# Patient Record
Sex: Female | Born: 1990 | Race: Black or African American | Hispanic: No | Marital: Single | Smoking: Current every day smoker
Health system: Southern US, Community
[De-identification: ages and names within clinical notes are randomized; demographics above are authoritative.]

## PROBLEM LIST (undated history)

## (undated) ENCOUNTER — Ambulatory Visit: Discharge status: Absent without leave | Payer: Self-pay

## (undated) DIAGNOSIS — F329 Major depressive disorder, single episode, unspecified: Secondary | ICD-10-CM

## (undated) DIAGNOSIS — F32A Depression, unspecified: Secondary | ICD-10-CM

## (undated) DIAGNOSIS — F319 Bipolar disorder, unspecified: Secondary | ICD-10-CM

## (undated) DIAGNOSIS — F419 Anxiety disorder, unspecified: Secondary | ICD-10-CM

## (undated) SURGERY — Surgical Case
Anesthesia: *Unknown

---
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
## 2012-01-15 ENCOUNTER — Encounter (HOSPITAL_COMMUNITY): Payer: Self-pay

## 2012-01-15 ENCOUNTER — Emergency Department (HOSPITAL_COMMUNITY)
Admission: EM | Admit: 2012-01-15 | Discharge: 2012-01-15 | Disposition: A | Discharge status: Home / Self Care | Payer: Self-pay | Attending: Emergency Medicine | Admitting: Emergency Medicine

## 2012-01-15 DIAGNOSIS — R059 Cough, unspecified: Secondary | ICD-10-CM | POA: Insufficient documentation

## 2012-01-15 DIAGNOSIS — Z79899 Other long term (current) drug therapy: Secondary | ICD-10-CM | POA: Insufficient documentation

## 2012-01-15 DIAGNOSIS — R070 Pain in throat: Secondary | ICD-10-CM | POA: Insufficient documentation

## 2012-01-15 DIAGNOSIS — H6690 Otitis media, unspecified, unspecified ear: Secondary | ICD-10-CM | POA: Insufficient documentation

## 2012-01-15 DIAGNOSIS — R05 Cough: Secondary | ICD-10-CM | POA: Insufficient documentation

## 2012-01-15 DIAGNOSIS — H9209 Otalgia, unspecified ear: Secondary | ICD-10-CM | POA: Insufficient documentation

## 2012-01-15 DIAGNOSIS — J3489 Other specified disorders of nose and nasal sinuses: Secondary | ICD-10-CM | POA: Insufficient documentation

## 2012-01-15 DIAGNOSIS — F319 Bipolar disorder, unspecified: Secondary | ICD-10-CM | POA: Insufficient documentation

## 2012-01-15 HISTORY — DX: Bipolar disorder, unspecified: F31.9

## 2012-01-15 MED ORDER — AMOXICILLIN 500 MG PO CAPS: 500.0000 mg | ORAL_CAPSULE | Freq: Three times a day (TID) | ORAL | Status: AC

## 2012-01-15 MED ORDER — AMOXICILLIN 500 MG PO CAPS
500.0000 mg | ORAL_CAPSULE | Freq: Once | ORAL | Status: AC
  Administered 2012-01-15: 500 mg via ORAL
  Filled 2012-01-15: qty 1
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
## 2012-01-15 NOTE — ED Notes (Signed)
Pt complains of left ear pain since 9pm tonight, unable to sleep

## 2012-01-15 NOTE — ED Provider Notes (Signed)
History     CSN: [id number]  Arrival date & time 01/15/12  0201   First MD Initiated Contact with Patient 01/15/12 [phone]      Chief Complaint  Patient presents with  . Otalgia    (Consider location/radiation/quality/duration/timing/severity/associated sxs/prior treatment) HPI Comments: Patient here with left ear pain - states that she thinks she may have just gotten over a sinus infection and awoke this evening with the ear pain and was unable to return to sleep - deneis fever and chills, drainage from the ear, reports rhinorrhea and mild sore throat.  Patient is a 22 y.o. female presenting with ear pain. The history is provided by the patient. No language interpreter was used.  Otalgia This is a new problem. The current episode started less than 1 hour ago. There is pain in the left ear. The problem occurs constantly. The problem has been gradually worsening. There has been no fever. The pain is at a severity of 6/10. The pain is moderate. Associated symptoms include rhinorrhea and cough. Pertinent negatives include no ear discharge, no headaches, no hearing loss, no sore throat, no abdominal pain, no diarrhea, no vomiting, no neck pain and no rash. Her past medical history does not include chronic ear infection.    Past Medical History  Diagnosis Date  . Bipolar 1 disorder     History reviewed. No pertinent past surgical history.  History reviewed. No pertinent family history.  History  Substance Use Topics  . Smoking status: Current Everyday Smoker  . Smokeless tobacco: Not on file  . Alcohol Use: Yes    OB History    Grav Para Term Preterm Abortions TAB SAB Ect Mult Living                  Review of Systems  HENT: Positive for ear pain and rhinorrhea. Negative for hearing loss, sore throat, neck pain and ear discharge.   Respiratory: Positive for cough.   Gastrointestinal: Negative for vomiting, abdominal pain and diarrhea.  Skin: Negative for rash.  Neurological:  Negative for headaches.  All other systems reviewed and are negative.    Allergies  Review of patient's allergies indicates no known allergies.  Home Medications   Current Outpatient Rx  Name Route Sig Dispense Refill  . HYDROXYZINE HCL 50 MG PO TABS Oral Take 50 mg by mouth every evening.    Marland Kitchen LURASIDONE HCL 40 MG PO TABS Oral Take by mouth daily with breakfast.      There were no vitals taken for this visit.  Physical Exam  Nursing note and vitals reviewed. Constitutional: She is oriented to person, place, and time. She appears well-developed and well-nourished. No distress.  HENT:  Head: Normocephalic and atraumatic.  Right Ear: External ear normal.  Mouth/Throat: Oropharynx is clear and moist. No oropharyngeal exudate.       Rhinorrhea, left ear with no mastoid pain, no drainage but marked erythema noted within the canal without effusion behind the ear.  Eyes: Conjunctivae are normal. Pupils are equal, round, and reactive to light. No scleral icterus.  Neck: Normal range of motion. Neck supple.  Cardiovascular: Normal rate, regular rhythm and normal heart sounds.  Exam reveals no gallop and no friction rub.   No murmur heard. Pulmonary/Chest: Effort normal. She exhibits no tenderness.  Abdominal: Soft. Bowel sounds are normal. She exhibits no distension. There is no tenderness.  Musculoskeletal: Normal range of motion.  Lymphadenopathy:    She has no cervical adenopathy.  Neurological: She  is alert and oriented to person, place, and time.  Skin: Skin is warm and dry. No rash noted. No erythema. No pallor.  Psychiatric: She has a normal mood and affect. Her behavior is normal. Judgment and thought content normal.    ED Course  Procedures (including critical care time)  Labs Reviewed - No data to display No results found.   Left otitis media   MDM  Left OM wtihout evidence of mastoiditis, effusion, or concerning findings.        Izola Price [place],  [place] 01/15/12 [phone]

## 2012-01-15 NOTE — ED Provider Notes (Signed)
Medical screening examination/treatment/procedure(s) were performed by non-physician practitioner and as supervising physician I was immediately available for consultation/collaboration.   Thaxton Pelley A. Vivia Rosenburg, MD 01/15/12 2256 

## 2012-01-15 NOTE — ED Notes (Signed)
Pt given a sandwich with her antibiotic pill

## 2013-10-21 ENCOUNTER — Emergency Department (HOSPITAL_COMMUNITY)
Admission: EM | Admit: 2013-10-21 | Discharge: 2013-10-21 | Disposition: A | Discharge status: Home / Self Care | Payer: Self-pay | Attending: Emergency Medicine | Admitting: Emergency Medicine

## 2013-10-21 ENCOUNTER — Encounter (HOSPITAL_COMMUNITY): Payer: Self-pay | Admitting: Emergency Medicine

## 2013-10-21 ENCOUNTER — Emergency Department (HOSPITAL_COMMUNITY): Payer: Self-pay

## 2013-10-21 DIAGNOSIS — F17200 Nicotine dependence, unspecified, uncomplicated: Secondary | ICD-10-CM | POA: Insufficient documentation

## 2013-10-21 DIAGNOSIS — S0003XA Contusion of scalp, initial encounter: Secondary | ICD-10-CM | POA: Insufficient documentation

## 2013-10-21 DIAGNOSIS — Z8659 Personal history of other mental and behavioral disorders: Secondary | ICD-10-CM | POA: Insufficient documentation

## 2013-10-21 MED ORDER — HYDROCODONE-ACETAMINOPHEN 5-325 MG PO TABS
1.0000 | ORAL_TABLET | Freq: Once | ORAL | Status: AC
  Administered 2013-10-21: 1 via ORAL
  Filled 2013-10-21: qty 1

## 2013-10-21 MED ORDER — TRAMADOL HCL 50 MG PO TABS: 50.0000 mg | ORAL_TABLET | Freq: Four times a day (QID) | ORAL | Status: DC | PRN
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
## 2013-10-21 NOTE — ED Provider Notes (Signed)
Medical screening examination/treatment/procedure(s) were performed by non-physician practitioner and as supervising physician I was immediately available for consultation/collaboration.  Sunnie Nielsen, MD 10/21/13 [phone]
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
## 2013-10-31 ENCOUNTER — Encounter (HOSPITAL_COMMUNITY): Payer: Self-pay

## 2013-10-31 ENCOUNTER — Emergency Department (HOSPITAL_COMMUNITY)
Admission: EM | Admit: 2013-10-31 | Discharge: 2013-10-31 | Disposition: A | Discharge status: Other Acute Inpatient Hospital | Payer: Self-pay | Attending: Emergency Medicine | Admitting: Emergency Medicine

## 2013-10-31 ENCOUNTER — Encounter (HOSPITAL_COMMUNITY): Payer: Self-pay | Admitting: Emergency Medicine

## 2013-10-31 ENCOUNTER — Inpatient Hospital Stay (HOSPITAL_COMMUNITY)
Admission: AD | Admit: 2013-10-31 | Discharge: 2013-11-06 | DRG: 885 | Disposition: A | Discharge status: Home / Self Care | Payer: Federal, State, Local not specified - Other | Source: Intra-hospital | Attending: Psychiatry | Admitting: Psychiatry

## 2013-10-31 DIAGNOSIS — F17200 Nicotine dependence, unspecified, uncomplicated: Secondary | ICD-10-CM | POA: Insufficient documentation

## 2013-10-31 DIAGNOSIS — R45851 Suicidal ideations: Secondary | ICD-10-CM

## 2013-10-31 DIAGNOSIS — Z5900 Homelessness unspecified: Secondary | ICD-10-CM

## 2013-10-31 DIAGNOSIS — F332 Major depressive disorder, recurrent severe without psychotic features: Principal | ICD-10-CM | POA: Diagnosis present

## 2013-10-31 DIAGNOSIS — F329 Major depressive disorder, single episode, unspecified: Secondary | ICD-10-CM

## 2013-10-31 DIAGNOSIS — Z79899 Other long term (current) drug therapy: Secondary | ICD-10-CM

## 2013-10-31 DIAGNOSIS — F411 Generalized anxiety disorder: Secondary | ICD-10-CM | POA: Diagnosis present

## 2013-10-31 DIAGNOSIS — F1910 Other psychoactive substance abuse, uncomplicated: Secondary | ICD-10-CM | POA: Diagnosis present

## 2013-10-31 DIAGNOSIS — Z3202 Encounter for pregnancy test, result negative: Secondary | ICD-10-CM | POA: Insufficient documentation

## 2013-10-31 DIAGNOSIS — F4310 Post-traumatic stress disorder, unspecified: Secondary | ICD-10-CM | POA: Diagnosis present

## 2013-10-31 DIAGNOSIS — F319 Bipolar disorder, unspecified: Secondary | ICD-10-CM | POA: Diagnosis present

## 2013-10-31 HISTORY — DX: Major depressive disorder, single episode, unspecified: F32.9

## 2013-10-31 HISTORY — DX: Depression, unspecified: F32.A

## 2013-10-31 HISTORY — DX: Anxiety disorder, unspecified: F41.9

## 2013-10-31 LAB — URINE MICROSCOPIC-ADD ON

## 2013-10-31 LAB — RAPID URINE DRUG SCREEN, HOSP PERFORMED
Amphetamines: NOT DETECTED
Benzodiazepines: NOT DETECTED
Opiates: NOT DETECTED
Tetrahydrocannabinol: POSITIVE — AB

## 2013-10-31 LAB — COMPREHENSIVE METABOLIC PANEL
ALT: 23 U/L (ref 0–35)
AST: 24 U/L (ref 0–37)
Albumin: 3.1 g/dL — ABNORMAL LOW (ref 3.5–5.2)
CO2: 25 mEq/L (ref 19–32)
Calcium: 8.6 mg/dL (ref 8.4–10.5)
Chloride: 103 mEq/L (ref 96–112)
Creatinine, Ser: 1.06 mg/dL (ref 0.50–1.10)
Sodium: 136 mEq/L (ref 135–145)

## 2013-10-31 LAB — CBC
MCH: 27.2 pg (ref 26.0–34.0)
MCV: 81.5 fL (ref 78.0–100.0)
Platelets: 305 10*3/uL (ref 150–400)
RBC: 5.25 MIL/uL — ABNORMAL HIGH (ref 3.87–5.11)
RDW: 15.9 % — ABNORMAL HIGH (ref 11.5–15.5)
WBC: 12.8 10*3/uL — ABNORMAL HIGH (ref 4.0–10.5)

## 2013-10-31 LAB — URINALYSIS, ROUTINE W REFLEX MICROSCOPIC
Bilirubin Urine: NEGATIVE
Glucose, UA: NEGATIVE mg/dL
Hgb urine dipstick: NEGATIVE
Ketones, ur: NEGATIVE mg/dL
Protein, ur: NEGATIVE mg/dL
Urobilinogen, UA: 1 mg/dL (ref 0.0–1.0)

## 2013-10-31 LAB — SALICYLATE LEVEL: Salicylate Lvl: <2 mg/dL — ABNORMAL LOW (ref 2.8–20.0)

## 2013-10-31 MED ORDER — ONDANSETRON HCL 4 MG PO TABS: 4.0000 mg | ORAL_TABLET | Freq: Three times a day (TID) | ORAL | Status: DC | PRN

## 2013-10-31 MED ORDER — IBUPROFEN 200 MG PO TABS: 600.0000 mg | ORAL_TABLET | Freq: Three times a day (TID) | ORAL | Status: DC | PRN

## 2013-10-31 MED ORDER — ACETAMINOPHEN 325 MG PO TABS: 650.0000 mg | ORAL_TABLET | ORAL | Status: DC | PRN

## 2013-10-31 MED ORDER — NICOTINE 21 MG/24HR TD PT24
21.0000 mg | MEDICATED_PATCH | Freq: Every day | TRANSDERMAL | Status: DC
  Filled 2013-10-31: qty 1

## 2013-10-31 MED ORDER — MAGNESIUM HYDROXIDE 400 MG/5ML PO SUSP: 30.0000 mL | Freq: Every day | ORAL | Status: DC | PRN

## 2013-10-31 MED ORDER — LORAZEPAM 1 MG PO TABS: 1.0000 mg | ORAL_TABLET | Freq: Three times a day (TID) | ORAL | Status: DC | PRN

## 2013-10-31 MED ORDER — TRAMADOL HCL 50 MG PO TABS: 50.0000 mg | ORAL_TABLET | Freq: Four times a day (QID) | ORAL | Status: DC | PRN

## 2013-10-31 MED ORDER — ALUM & MAG HYDROXIDE-SIMETH 200-200-20 MG/5ML PO SUSP: 30.0000 mL | ORAL | Status: DC | PRN

## 2013-10-31 MED ORDER — TRAZODONE HCL 50 MG PO TABS
50.0000 mg | ORAL_TABLET | Freq: Every evening | ORAL | Status: DC | PRN
  Administered 2013-10-31 – 2013-11-01 (×2): 50 mg via ORAL
  Filled 2013-10-31 (×2): qty 1

## 2013-10-31 MED ORDER — ZOLPIDEM TARTRATE 5 MG PO TABS: 5.0000 mg | ORAL_TABLET | Freq: Every evening | ORAL | Status: DC | PRN

## 2013-10-31 MED ORDER — NITROFURANTOIN MONOHYD MACRO 100 MG PO CAPS
100.0000 mg | ORAL_CAPSULE | Freq: Two times a day (BID) | ORAL | Status: DC
  Administered 2013-10-31: 100 mg via ORAL
  Filled 2013-10-31 (×2): qty 1
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
## 2013-10-31 NOTE — ED Notes (Signed)
[facility] here to transport to [facility]. Ambulatory without difficulty. No complaints voiced. Belongings bag x2 given to driver.
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
## 2013-10-31 NOTE — Progress Notes (Signed)
Adult Psychoeducational Group Note  Date:  10/31/2013 Time:  8:00pm  Group Topic/Focus:  Wrap-Up Group:   The focus of this group is to help patients review their daily goal of treatment and discuss progress on daily workbooks.  Participation Level:  Did Not Attend  Participation Quality:    Affect:   Cognitive:    Insight:   Engagement in Group:   Modes of Intervention:    Additional Comments:  Pt did not attend group  Shelly Bombard D 10/31/2013, 9:33 PM

## 2013-10-31 NOTE — ED Notes (Signed)
[facility] notified of transportation needed to [facility]. 
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
## 2013-10-31 NOTE — [facility] Group Notes (Signed)
[facility] Group Notes: (Clinical Social Work)   10/31/2013      Type of Therapy:  Group Therapy   Participation Level:  Did Not Attend    Ambrose Mantle, LCSW 10/31/2013, 4:01 PM

## 2013-10-31 NOTE — Progress Notes (Signed)
Psychoeducational Group Note  Date: 10/31/2013 Time:  1015  Group Topic/Focus:  Identifying Needs:   The focus of this group is to help patients identify their personal needs that have been historically problematic and identify healthy behaviors to address their needs.  Participation Level:  Active  Participation Quality:  Attentive  Affect:  Appropriate  Cognitive:  Oriented  Insight:  Improving  Engagement in Group:  Engaged  Additional Comments:    Kaylanie Capili A 
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
## 2013-11-01 ENCOUNTER — Encounter (HOSPITAL_COMMUNITY): Payer: Self-pay | Admitting: Psychiatry

## 2013-11-01 DIAGNOSIS — F411 Generalized anxiety disorder: Secondary | ICD-10-CM

## 2013-11-01 DIAGNOSIS — F332 Major depressive disorder, recurrent severe without psychotic features: Principal | ICD-10-CM

## 2013-11-01 DIAGNOSIS — F4310 Post-traumatic stress disorder, unspecified: Secondary | ICD-10-CM

## 2013-11-01 MED ORDER — QUETIAPINE FUMARATE 25 MG PO TABS
25.0000 mg | ORAL_TABLET | Freq: Two times a day (BID) | ORAL | Status: DC
  Administered 2013-11-01 – 2013-11-02 (×3): 25 mg via ORAL
  Filled 2013-11-01 (×7): qty 1

## 2013-11-01 MED ORDER — LORATADINE 10 MG PO TABS: 10.0000 mg | ORAL_TABLET | Freq: Every day | ORAL | Status: DC

## 2013-11-01 MED ORDER — FLUOXETINE HCL 10 MG PO CAPS
10.0000 mg | ORAL_CAPSULE | Freq: Every day | ORAL | Status: DC
  Administered 2013-11-01 – 2013-11-02 (×2): 10 mg via ORAL
  Filled 2013-11-01 (×4): qty 1

## 2013-11-01 MED ORDER — HYDROXYZINE HCL 25 MG PO TABS: 25.0000 mg | ORAL_TABLET | Freq: Four times a day (QID) | ORAL | Status: DC | PRN

## 2013-11-01 MED ORDER — PRAZOSIN HCL 1 MG PO CAPS
2.0000 mg | ORAL_CAPSULE | Freq: Every day | ORAL | Status: DC
  Administered 2013-11-01 – 2013-11-05 (×5): 2 mg via ORAL
  Filled 2013-11-01 (×2): qty 1
  Filled 2013-11-01: qty 28
  Filled 2013-11-01 (×5): qty 1

## 2013-11-01 MED ORDER — QUETIAPINE FUMARATE 50 MG PO TABS
50.0000 mg | ORAL_TABLET | Freq: Every day | ORAL | Status: DC
  Administered 2013-11-01 – 2013-11-02 (×2): 50 mg via ORAL
  Filled 2013-11-01 (×4): qty 1

## 2013-11-01 MED ORDER — PSEUDOEPHEDRINE HCL 30 MG PO TABS: 30.0000 mg | ORAL_TABLET | Freq: Every day | ORAL | Status: DC

## 2013-11-01 NOTE — Progress Notes (Signed)
Psychoeducational Group Note  Date:  11/01/2013 Time:  1015  Group Topic/Focus:  Making Healthy Choices:   The focus of this group is to help patients identify negative/unhealthy choices they were using prior to admission and identify positive/healthier coping strategies to replace them upon discharge.  Participation Level:  Active  Participation Quality:  Appropriate  Affect:  Anxious and Appropriate  Cognitive:  Oriented  Insight:  Improving  Engagement in Group:  Engaged  Additional Comments:    Jane Finley A 11/01/2013 

## 2013-11-01 NOTE — Progress Notes (Signed)
Psychoeducational Group Note  Date: 11/01/2013 Time: 0930  Group Topic/Focus:  Gratefulness:  The focus of this group is to help patients identify what two things they are most grateful for in their lives. What helps ground them and to center them on their work to their recovery.  Participation Level:  Active  Participation Quality: particiates  Affect:  Appropriate  Cognitive:  Oriented  Insight:  Improving  Engagement in Group:  Engaged  Additional Comments:   Lajuana Patchell A   

## 2013-11-01 NOTE — Progress Notes (Signed)
Report received from C. Microbiologist. Writer entered patients room and observed her sitting in bed reading a magazine. Patient voiced no complaints and when writer asked if she needed anything she reported "I'm all right for now." Safety maintained on unit with 15 min checks, will continue to monitor.

## 2013-11-01 NOTE — Progress Notes (Signed)
Adult Psychoeducational Group Note  Date:  11/01/2013 Time:  8:00pm Group Topic/Focus:  Wrap-Up Group:   The focus of this group is to help patients review their daily goal of treatment and discuss progress on daily workbooks.  Participation Level:  Active  Participation Quality:  Appropriate and Attentive  Affect:  Appropriate  Cognitive:  Alert and Appropriate  Insight: Appropriate  Engagement in Group:  Engaged  Modes of Intervention:  Discussion and Education  Additional Comments:  Pt attended and participated in group. Wrap up group discussion talked about 15 minute checks,enviromentals, and falls precautions. The question was asked How was your day? Pt stated she had a rough day up and down. She does not like to be around people it makes her aggravated.   Shelly Bombard D 11/01/2013, 9:26 PM
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
## 2013-11-01 NOTE — [facility] Group Notes (Signed)
[facility] Group Notes: (Clinical Social Work)   11/01/2013      Type of Therapy:  Group Therapy   Participation Level:  Did Not Attend    Ambrose Mantle, LCSW 11/01/2013, 3:11 PM
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
## 2013-11-01 NOTE — Progress Notes (Signed)
D: Pt  C/o " I can not sleep with another person in my room ". A: charge RN notified. The unit is full & someone is already sleeping in the Quiet -room. R: Unable to move pt @ this time.Supported & encouraged. Continues on 15 minute checks. Pt safety maintained

## 2013-11-01 NOTE — Progress Notes (Signed)
Writer observed patient lying in bed asleep and she was easily aroused when her name was called. Writer informed patient that she had missed wrap up group and inquired if she wanted a snack in which she did. Patient was admitted earlier today and reports that she was tired and needed the sleep. Writer informed patient of medication available if needed and she was given clothing so that she could shower and prepare for bed. Patient currently denies si/hi/a/v hallucinations. Safety maintained with 15 min checks, will continue to monitor.

## 2013-11-02 DIAGNOSIS — F1994 Other psychoactive substance use, unspecified with psychoactive substance-induced mood disorder: Secondary | ICD-10-CM

## 2013-11-02 DIAGNOSIS — F1910 Other psychoactive substance abuse, uncomplicated: Secondary | ICD-10-CM

## 2013-11-02 DIAGNOSIS — F4310 Post-traumatic stress disorder, unspecified: Secondary | ICD-10-CM | POA: Diagnosis present

## 2013-11-02 LAB — URINE CULTURE

## 2013-11-02 MED ORDER — TRAZODONE HCL 100 MG PO TABS
100.0000 mg | ORAL_TABLET | Freq: Every evening | ORAL | Status: DC | PRN
  Administered 2013-11-02 – 2013-11-05 (×4): 100 mg via ORAL
  Filled 2013-11-02: qty 14
  Filled 2013-11-02 (×4): qty 1

## 2013-11-02 MED ORDER — FLUOXETINE HCL 20 MG PO CAPS
20.0000 mg | ORAL_CAPSULE | Freq: Every day | ORAL | Status: DC
  Administered 2013-11-03 – 2013-11-04 (×2): 20 mg via ORAL
  Filled 2013-11-02 (×4): qty 1

## 2013-11-02 MED ORDER — HYDROXYZINE HCL 25 MG PO TABS: 25.0000 mg | ORAL_TABLET | Freq: Four times a day (QID) | ORAL | Status: DC | PRN

## 2013-11-02 NOTE — Progress Notes (Signed)
Adult Psychoeducational Group Note  Date:  11/02/2013 Time:  11:00am Group Topic/Focus:  Self Care:   The focus of this group is to help patients understand the importance of self-care in order to improve or restore emotional, physical, spiritual, interpersonal, and financial health.  Participation Level:  Active  Participation Quality:  Appropriate and Attentive  Affect:  Appropriate  Cognitive:  Alert and Appropriate  Insight: Appropriate  Engagement in Group:  Engaged  Modes of Intervention:  Discussion and Education  Additional Comments:  Pt attended and participated in group. Discussion was on wellness and self care. Pt. Talked about the one thing that made them angry and how they deal with it and what is a more positive way of dealing with their anger. Pt also stated one thing that makes them happy and how they can think of that when they begin to feel anxiety or depression coming on.   Shelly Bombard D 11/02/2013, 1:23 PM

## 2013-11-02 NOTE — Progress Notes (Signed)
Patient ID: Jane Finley, female   DOB: 13-May-1990, 23 y.o.   MRN: [id number] D- Patient reports she slept with medication lat night but thinks that minipress might have made her nightmares worse.  Her appetite is improving.  She did not go to breakfast but did eat all her lunch.  Her energy level is low and she rates her depression at 6/10, hopelessness at 8/10,  and her anxiety a t6/10.  She reports thoughts of self harm off and on and she does contract for safety. A- asked patient what she is doing to work toward her goal .   She says that her goal is to be emotionally stable and she plans to work toward that goal by taking her medication and attending and participating in groups.

## 2013-11-02 NOTE — Progress Notes (Signed)
[facility] MD Progress Note  11/02/2013 3:20 PM Jane Finley  MRN:  [id number]  Subjective:  Patient has been compliant with her inpatient hospitalization program and medication management. Patient complained feeling guide and up and down all night without much sleep. She has stated that I need my body on schedule and medication makes me tired. Patient has plans of talking with therapies, attending groups for therapeutic benefits and case manager regarding disposition plans. Patient is highly guarded about talking about her past, she that she wanted to go and talk about her future plans Patient rates her depression 8/10 and anxiety 10 out of 10, she continued to have a suicidal thoughts without intentions or plans at this time.  Diagnosis:   DSM5: Schizophrenia Disorders:   Obsessive-Compulsive Disorders:   Trauma-Stressor Disorders:  Posttraumatic Stress Disorder (309.81) Substance/Addictive Disorders:  Cannabis Use Disorder - Mild (305.20) Depressive Disorders:  Major Depressive Disorder - Severe (296.23)  Axis I: Major Depression, Recurrent severe, Post Traumatic Stress Disorder, Substance Abuse and Substance Induced Mood Disorder  ADL's:  Impaired  Sleep: Fair  Appetite:  Fair  Suicidal Ideation:  Patient endorses suicidal thoughts and jumping out of the bridge and contracts for safety in the hospital Homicidal Ideation:  Denied AEB (as evidenced by):  Psychiatric Specialty Exam: ROS  Blood pressure 123/76, pulse 79, temperature 98.4 F (36.9 C), temperature source Oral, resp. rate 20, height 5' 2.21" (1.58 m), weight 95.709 kg (211 lb), last menstrual period 10/09/2013.Body mass index is 38.34 kg/(m^2).  General Appearance: Disheveled and Guarded  Eye Contact::  Minimal  Speech:  Clear and Coherent and Slow  Volume:  Decreased  Mood:  Angry, Anxious, Depressed, Hopeless, Irritable and Worthless  Affect:  Constricted and Depressed  Thought Process:  Goal Directed and Intact   Orientation:  Full (Time, Place, and Person)  Thought Content:  Paranoid Ideation and Rumination  Suicidal Thoughts:  Yes.  with intent/plan  Homicidal Thoughts:  No  Memory:  Immediate;   Fair  Judgement:  Impaired  Insight:  Lacking  Psychomotor Activity:  Psychomotor Retardation  Concentration:  Fair  Recall:  Fair  Akathisia:  NA  Handed:  Right  AIMS (if indicated):     Assets:  Communication Skills Desire for Improvement Physical Health Resilience Transportation  Sleep:  Number of Hours: 5.5   Current Medications: Current Facility-Administered Medications  Medication Dose Route Frequency Provider Last Rate Last Dose  . [START ON 11/03/2013] FLUoxetine (PROZAC) capsule 20 mg  20 mg Oral Daily Nehemiah Settle, MD      . hydrOXYzine (ATARAX/VISTARIL) tablet 25 mg  25 mg Oral Q6H PRN Nehemiah Settle, MD      . magnesium hydroxide (MILK OF MAGNESIA) suspension 30 mL  30 mL Oral Daily PRN Court Joy, PA-C      . prazosin (MINIPRESS) capsule 2 mg  2 mg Oral QHS Nanine Means, NP   2 mg at 11/01/13 2122  . QUEtiapine (SEROQUEL) tablet 50 mg  50 mg Oral QHS Nanine Means, NP   50 mg at 11/01/13 2122  . traZODone (DESYREL) tablet 100 mg  100 mg Oral QHS PRN Nehemiah Settle, MD        Lab Results: No results found for this or any previous visit (from the past 48 hour(s)).  Physical Findings: AIMS: Facial and Oral Movements Muscles of Facial Expression: None, normal Lips and Perioral Area: None, normal Jaw: None, normal Tongue: None, normal,Extremity Movements Upper (arms, wrists, hands, fingers): None,  normal Lower (legs, knees, ankles, toes): None, normal, Trunk Movements Neck, shoulders, hips: None, normal, Overall Severity Severity of abnormal movements (highest score from questions above): None, normal Incapacitation due to abnormal movements: None, normal Patient's awareness of abnormal movements (rate only patient's report): No Awareness,  Dental Status Current problems with teeth and/or dentures?: No Does patient usually wear dentures?: No  CIWA:    COWS:     Treatment Plan Summary: Daily contact with patient to assess and evaluate symptoms and progress in treatment Medication management  Plan: Change Seroquel 200 mg at bedtime Hydroxyzine 25 mg every 6 hours for anxiety Continue Minipress 2 mg at bedtime Increase fluoxetine 20 mg daily Treatment Plan/Recommendations:   1. Admit for crisis management and stabilization. 2. Medication management to reduce current symptoms to base line and improve the patient's overall level of functioning. 3. Treat health problems as indicated. 4. Develop treatment plan to decrease risk of relapse upon discharge and to reduce the need for readmission. 5. Psycho-social education regarding relapse prevention and self care. 6. Health care follow up as needed for medical problems. 7. Restart home medications where appropriate.   Medical Decision Making Problem Points:  Established problem, worsening (2), New problem, with no additional work-up planned (3), Review of last therapy session (1) and Review of psycho-social stressors (1) Data Points:  Review or order clinical lab tests (1) Review or order medicine tests (1) Review of medication regiment & side effects (2) Review of new medications or change in dosage (2)  I certify that inpatient services furnished can reasonably be expected to improve the patient's condition.   Nehemiah Settle., M.D. 11/02/2013, 3:20 PM

## 2013-11-02 NOTE — Progress Notes (Signed)
D: Patient in the dayroom on approach.  Patient states her day was long.  Patient appears blunted and depressed.  Patient states she did have a better day today.  Patient states she has been talking more today.  Patient states she has been learning better communication.  Patient states she is unsure of her plan when she is discharged.  Patient denies SI/HI and denies AVH. A: Staff to monitor Q 15 mins for safety.  Encouragement and support offered.  Scheduled medications administered per orders.  Trazodone administered prn for sleep. R: Patient remains safe on the unit.  Patient attended group tonight.  Patient visible on the unit and interacting with peers.  Patient taking administered medications.

## 2013-11-02 NOTE — [facility] Group Notes (Signed)
[facility] LCSW Group Therapy  Overcoming Obstacles   1:15 - 2:30 PM  11/02/2013 3:45 PM  Type of Therapy:  Group Therapy  Participation Level:  Did Not Attend   Wynn Banker 11/02/2013, 3:45 PM

## 2013-11-02 NOTE — Progress Notes (Signed)
Adult Psychoeducational Group Note  Date:  11/02/2013 Time:  8:34 PM  Group Topic/Focus:  Wrap-Up Group:   The focus of this group is to help patients review their daily goal of treatment and discuss progress on daily workbooks.  Participation Level:  Active  Participation Quality:  Appropriate and Supportive  Affect:  Appropriate  Cognitive:  Appropriate  Insight: Appropriate  Engagement in Group:  Engaged and Supportive  Modes of Intervention:  Discussion, Socialization and Support  Additional Comments:  Writer reminded pts of 15 min checks, environmentals, and meds. Pt stated her day was terrible because it was long. Pt stated she has felt bad since she has been here, but today got better as it progressed. Caswell Corwin  11/02/2013, 8:34 PM

## 2013-11-02 NOTE — [facility] Group Notes (Signed)
[facility] LCSW Aftercare Discharge Planning Group Note   11/02/2013 3:45 PM  Participation Quality:  Did not attend  Jane Finley, Jane Finley

## 2013-11-02 NOTE — Tx Team (Signed)
Interdisciplinary Treatment Plan Update   Date Reviewed:  11/02/2013  Time Reviewed:  9:51 AM  Progress in Treatment:   Attending groups: Yes Participating in groups: Yes Taking medication as prescribed: Yes  Tolerating medication: Yes Family/Significant other contact made:No, but will ask patient for consent for collateral contact Patient understands diagnosis: Yes  Discussing patient identified problems/goals with staff: Yes Medical problems stabilized or resolved: Yes Denies suicidal/homicidal ideation: Yes Patient has not harmed self or others: Yes  For review of initial/current patient goals, please see plan of care.  Estimated Length of Stay:  3-5 days  Reasons for Continued Hospitalization:  Anxiety Depression Medication stabilization Suicidal ideation  New Problems/Goals identified:    Discharge Plan or Barriers:   Home with outpatient follow up to be determined  Additional Comments:  Attendees:  Patient:  11/02/2013 9:51 AM   Signature: Mervyn Gay, MD 11/02/2013 9:51 AM  Signature:  Verne Spurr, PA 11/02/2013 9:51 AM  Signature: Roswell Miners, RN 11/02/2013 9:51 AM  Signature:Beverly Terrilee Croak, RN 11/02/2013 9:51 AM  Signature:  Neill Loft RN 11/02/2013 9:51 AM  Signature:  Juline Patch, LCSW 11/02/2013 9:51 AM  Signature: Cammy Brochure, RN  11/02/2013 9:51 AM  Signature:  Delora Sutton,Care Coordinator 11/02/2013 9:51 AM   11/02/2013 9:51 AM   11/02/2013  9:51 AM   11/02/2013  9:51 AM   11/02/2013  9:51 AM    Scribe for Treatment Team:   Juline Patch,  11/02/2013 9:51 AM

## 2013-11-02 NOTE — Progress Notes (Signed)
[facility] Group Notes:  (Nursing/MHT/Case Management/Adjunct)  Date:  11/02/2013  Time:  3:36 PM  Type of Therapy:  Psychoeducational Skills  Participation Level:  Active  Participation Quality:  Appropriate  Affect:  Appropriate  Cognitive:  Appropriate  Insight:  Appropriate  Engagement in Group:  Engaged and Supportive  Modes of Intervention:  Activity  Summary of Progress/Problems:  [name] 11/02/2013, 3:36 PM 

## 2013-11-02 NOTE — Progress Notes (Signed)
Pt attended spiritual care group on grief and loss facilitated by chaplain Burnis Kingfisher.  Group opened with brief discussion and psycho-social ed around grief and loss in relationships and in relation to self - identifying life patterns, circumstances, changes that cause losses. Established group norm of speaking from own life experience. Group goal of establishing open and affirming space for members to share loss and experience with grief, normalize grief experience and provide psycho social education and grief support.   Jane Finley was present and attentive for duration of group.  She did not contribute to group discussion.   Belva Crome MDiv

## 2013-11-03 MED ORDER — QUETIAPINE FUMARATE 100 MG PO TABS
100.0000 mg | ORAL_TABLET | Freq: Every day | ORAL | Status: DC
  Administered 2013-11-03 – 2013-11-05 (×3): 100 mg via ORAL
  Filled 2013-11-03 (×4): qty 1
  Filled 2013-11-03: qty 14
  Filled 2013-11-03: qty 1
  Filled 2013-11-03: qty 14

## 2013-11-03 NOTE — Progress Notes (Signed)
Patient ID: Jane Finley, female   DOB: 1990-11-02, 23 y.o.   MRN: [id number] [facility] MD Progress Note  11/03/2013 4:42 PM Shalisa Mcquade  MRN:  [id number]  Subjective:  Patient notes that her medications were not changed as she expected them to be. She says her depression is a 4/10 at this moment, but her suicidal ideation continues to come and go. She is fully oriented and was an active participant in group. She has no plans for suicide and no intent. Yana continues to have a poor grasp of delayed gratification and feels that she needs to be taken care of.  She shows little insight into her responsibility for her role in her current situation. Diagnosis:   DSM5: Schizophrenia Disorders:   Obsessive-Compulsive Disorders:   Trauma-Stressor Disorders:  Posttraumatic Stress Disorder (309.81) Substance/Addictive Disorders:  Cannabis Use Disorder - Mild (305.20) Depressive Disorders:  Major Depressive Disorder - Severe (296.23)  Axis I: Major Depression, Recurrent severe, Post Traumatic Stress Disorder, Substance Abuse and Substance Induced Mood Disorder  ADL's:  Impaired  Sleep: Fair  Appetite:  Fair  Suicidal Ideation:  Patient endorses suicidal thoughts and jumping out of the bridge and contracts for safety in the hospital Homicidal Ideation:  Denied AEB (as evidenced by):  Psychiatric Specialty Exam: ROS  Blood pressure 107/71, pulse 76, temperature 97.8 F (36.6 C), temperature source Oral, resp. rate 18, height 5' 2.21" (1.58 m), weight 95.709 kg (211 lb), last menstrual period 10/09/2013.Body mass index is 38.34 kg/(m^2).  General Appearance: Disheveled and Guarded  Eye Contact::  Minimal  Speech:  Clear and Coherent and Slow  Volume:  Decreased  Mood:  Angry, Anxious, Depressed, Hopeless, Irritable and Worthless  Affect:  Constricted and Depressed  Thought Process:  Goal Directed and Intact  Orientation:  Full (Time, Place, and Person)  Thought Content:  Paranoid Ideation  and Rumination  Suicidal Thoughts:  Yes.  with intent/plan  Homicidal Thoughts:  No  Memory:  Immediate;   Fair  Judgement:  Impaired  Insight:  Lacking  Psychomotor Activity:  Psychomotor Retardation  Concentration:  Fair  Recall:  Fair  Akathisia:  NA  Handed:  Right  AIMS (if indicated):     Assets:  Communication Skills Desire for Improvement Physical Health Resilience Transportation  Sleep:  Number of Hours: 6.5   Current Medications: Current Facility-Administered Medications  Medication Dose Route Frequency Provider Last Rate Last Dose  . FLUoxetine (PROZAC) capsule 20 mg  20 mg Oral Daily Nehemiah Settle, MD   20 mg at 11/03/13 0818  . hydrOXYzine (ATARAX/VISTARIL) tablet 25 mg  25 mg Oral Q6H PRN Nehemiah Settle, MD      . magnesium hydroxide (MILK OF MAGNESIA) suspension 30 mL  30 mL Oral Daily PRN Court Joy, PA-C      . prazosin (MINIPRESS) capsule 2 mg  2 mg Oral QHS Nanine Means, NP   2 mg at 11/02/13 2104  . QUEtiapine (SEROQUEL) tablet 100 mg  100 mg Oral QHS Verne Spurr, PA-C      . traZODone (DESYREL) tablet 100 mg  100 mg Oral QHS PRN Nehemiah Settle, MD   100 mg at 11/02/13 2105    Lab Results: No results found for this or any previous visit (from the past 48 hour(s)).  Physical Findings: AIMS: Facial and Oral Movements Muscles of Facial Expression: None, normal Lips and Perioral Area: None, normal Jaw: None, normal Tongue: None, normal,Extremity Movements Upper (arms, wrists, hands, fingers): None, normal Lower (  legs, knees, ankles, toes): None, normal, Trunk Movements Neck, shoulders, hips: None, normal, Overall Severity Severity of abnormal movements (highest score from questions above): None, normal Incapacitation due to abnormal movements: None, normal Patient's awareness of abnormal movements (rate only patient's report): No Awareness, Dental Status Current problems with teeth and/or dentures?: No Does patient  usually wear dentures?: No  CIWA:  CIWA-Ar Total: 6 COWS:  COWS Total Score: 1  Treatment Plan Summary: Daily contact with patient to assess and evaluate symptoms and progress in treatment Medication management  Plan: Change Seroquel 100 mg at bedtime Continue Hydroxyzine 25 mg every 6 hours for anxiety Continue Minipress 2 mg at bedtime Increase fluoxetine 20 mg daily Treatment Plan/Recommendations:   1. Admit for crisis management and stabilization. 2. Medication management to reduce current symptoms to base line and improve the patient's overall level of functioning. 3. Treat health problems as indicated. 4. Develop treatment plan to decrease risk of relapse upon discharge and to reduce the need for readmission. 5. Psycho-social education regarding relapse prevention and self care. 6. Health care follow up as needed for medical problems. 7. Restart home medications where appropriate. 8. ELOS:  1-3 days.  Medical Decision Making Problem Points:  Established problem, worsening (2), New problem, with no additional work-up planned (3), Review of last therapy session (1) and Review of psycho-social stressors (1) Data Points:  Review or order clinical lab tests (1) Review or order medicine tests (1) Review of medication regiment & side effects (2) Review of new medications or change in dosage (2)  I certify that inpatient services furnished can reasonably be expected to improve the patient's condition.   Rona Ravens. [place] RPAC 4:46 PM 11/03/2013  Reviewed the information documented and agree with the treatment plan.  Angalina Ante,JANARDHAHA R. 11/04/2013 3:45 PM
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
## 2013-11-03 NOTE — Progress Notes (Signed)
The focus of this group is to educate the patient on the purpose and policies of crisis stabilization and provide a format to answer questions about their admission.  The group details unit policies and expectations of patients while admitted.  Patient attended 0900 nurse education orientation group this morning.  Patient actively participated, appropriate affect, alert, appropriate insight and engagement.  Today patient will work on 3 goals for discharge.  

## 2013-11-03 NOTE — Progress Notes (Signed)
D:  Patient's self inventory sheet, patient has poor sleep, improving appetite, low energy level, improving attention span.  Rated depression and hopelessness 8, anxiety 1.  Denied withdrawals.  SI off/on, contracts for safety.  Denied physical problems.  Plans to continue taking meds after discharge.  Needs financial assistance to purchase meds after discharge. A:  Medications administered per MD orders.  Emotional support and encouragement given patient throughout day. R:  Denied HI.  Denied A/V hallucinations.  Denied pain.  SI off/on, contracts for safety.  Will continue to monitor patient for safety with 15 minute checks.  Safety maintained.

## 2013-11-03 NOTE — [facility] Group Notes (Signed)
[facility] LCSW Group Therapy  11/03/2013  1:15 PM   Type of Therapy:  Group Therapy  Participation Level:  Active  Participation Quality:  Appropriate and Attentive  Affect:  Appropriate and Calm  Cognitive:  Alert and Appropriate  Insight:  Developing/Improving and Engaged  Engagement in Therapy:  Developing/Improving and Engaged  Modes of Intervention:  Clarification, Confrontation, Discussion, Education, Exploration, Limit-setting, Orientation, Problem-solving, Rapport Building, Dance movement psychotherapist, Socialization and Support  Summary of Progress/Problems: The topic for group therapy was feelings about diagnosis.  Pt actively participated in group discussion on their past and current diagnosis and how they feel towards this.  Pt also identified how society and family members judge them, based on their diagnosis as well as stereotypes and stigmas.   Pt shared that she feels judged by the doctors and professionals that have diagnosed her in the past and ignored by family members as they pretend nothing is wrong with her.  Pt was able to process how this felt for her and the impact it has had on her.  Pt actively participated and was engaged in group discussion.   Jane Ivan, LCSW 11/03/2013 3:21 PM

## 2013-11-03 NOTE — Progress Notes (Signed)
D: Patient in the dayroom working on a puzzle with peers on approach.  Patient appears brighter today.  Patient states she had a better day today.  Patient states she felt she was able to express her feelings today.  Patient denies SI/HI and denies AVH.  Patient did states that this morning she had SI.  Patient verbally contracts for safety.   A: Staff to monitor Q 15 mins for safety.  Encouragement and support offered.  Scheduled medications administered per orders. R: Patient remains safe on the unit.  Patient attended group tonight.  Patient visible on the unit and interacting with peers.  Patient taking administered medications.

## 2013-11-03 NOTE — Progress Notes (Signed)
Recreation Therapy Notes  Date: 11.04.2014 Time: 2:45pm Location: 500 Hall Dayroom  Group Topic: Software engineer Activities (AAA)  Behavioral Response: Engaged, Attentive   Affect: Euthymic  Clinical Observations/Feedback: Dog Team: [employer]. Patient interacted appropriately with peer, dog team, and LRT.   Marykay Lex Loza Prell, LRT/CTRS  Jearl Klinefelter 11/03/2013 4:59 PM

## 2013-11-03 NOTE — [facility] Suicide Risk Assessment (Signed)
[facility] Adult Inpatient Family/Significant Other Suicide Prevention Education  Suicide Prevention Education:   Patient Refusal for Family/Significant Other Suicide Prevention Education: The patient has refused to provide written consent for family/significant other to be provided Family/Significant Other Suicide Prevention Education during admission and/or prior to discharge.  Physician notified.  CSW provided suicide prevention information with patient.    The suicide prevention education provided includes the following:  Suicide risk factors  Suicide prevention and interventions  National Suicide Hotline telephone number  [facility] assessment telephone number  [facility] Emergency Assistance 911  [facility] and/or Residential Mobile Crisis Unit telephone number   Jane Finley, [place] 11/03/2013 10:05 AM
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
## 2013-11-03 NOTE — Progress Notes (Signed)
Recreation Therapy Notes  Date: 11.03.2014 Time: 3:00pm Location: 500 [address] Dayroom  Group Topic: Wellness  Goal Area(s) Addresses:  Patient will define components of whole wellness. Patient will verbalize benefit of whole wellness.  Behavioral Response: Did not attend.   Marykay Lex Meyli Boice, LRT/CTRS  Melat Wrisley L 11/03/2013 8:16 AM

## 2013-11-03 NOTE — ED Notes (Signed)
+   urine No treatment needed per Kaitlyn  Szekalski 

## 2013-11-04 MED ORDER — FLUOXETINE HCL 20 MG PO CAPS
40.0000 mg | ORAL_CAPSULE | Freq: Every day | ORAL | Status: DC
  Administered 2013-11-05 – 2013-11-06 (×2): 40 mg via ORAL
  Filled 2013-11-04: qty 28
  Filled 2013-11-04 (×4): qty 2

## 2013-11-04 NOTE — Progress Notes (Signed)
Adult Psychoeducational Group Note  Date:  11/04/2013 Time:  9:45 PM  Group Topic/Focus:  Wrap-Up Group:   The focus of this group is to help patients review their daily goal of treatment and discuss progress on daily workbooks.  Participation Level:  Active  Participation Quality:  Attentive  Affect:  Appropriate  Cognitive:  Appropriate  Insight: Appropriate  Engagement in Group:  Engaged  Modes of Intervention:  Discussion and Education  Additional Comments:  Pt was present and active during this group. Pt rated her day a 7.5.   Malachy Moan 11/04/2013, 9:45 PM

## 2013-11-04 NOTE — [facility] Group Notes (Signed)
[facility] LCSW Group Therapy  11/04/2013  1:15 PM   Type of Therapy:  Group Therapy  Participation Level:  Did Not Attend - pt was first meeting with Cathleen Fears, homeless liaison and then a provider  Reyes Ivan, LCSW 11/04/2013 2:31 PM

## 2013-11-04 NOTE — Progress Notes (Signed)
D: Patient in the dayroom interacting with peers on approach.  Patient states she had a good day.  Patient states she did not learn anythng new today.  Patient states she is planning on going to a shelter until her ID card comes in the mail and they she plans on going to either [employer].  Or the [employer].  Patient states sshe has passive SI but verbally contracts for afety.  Patient denies HI and denies AVH. A: Staff to monitor Q 15 mins for safety.  Encouragement and support offered.  Scheduled medications administered per orders. R: Patient remains safe on the unit.  Patient attended group tonight.  Patient visible on the unit and interacting with peers.  Patient taking administered medications.

## 2013-11-04 NOTE — Tx Team (Signed)
Interdisciplinary Treatment Plan Update (Adult)  Date: 11/04/2013  Time Reviewed:  9:45 AM  Progress in Treatment: Attending groups: Yes Participating in groups:  Yes Taking medication as prescribed:  Yes Tolerating medication:  Yes Family/Significant othe contact made: No, pt refused  Patient understands diagnosis:  Yes Discussing patient identified problems/goals with staff:  Yes Medical problems stabilized or resolved:  Yes Denies suicidal/homicidal ideation: Yes Issues/concerns per patient self-inventory:  Yes Other:  New problem(s) identified: N/A  Discharge Plan or Barriers: Pt will follow up at [facility] for medication management and therapy.  Discussed [facility] as an option for pt for placement.    Reason for Continuation of Hospitalization: Anxiety Depression Medication Stabilization  Comments: N/A  Estimated length of stay: 2-3 days  For review of initial/current patient goals, please see plan of care.  Attendees: Patient:     Family:     Physician:  Dr. Javier Glazier 11/04/2013 10:21 AM   Nursing:   Quintella Reichert, RN 11/04/2013 10:21 AM   Clinical Social Worker:  Reyes Ivan, LCSW 11/04/2013 10:21 AM   Other: Verne Spurr, PA 11/04/2013 10:21 AM   Other:  Frankey Shown, MA care coordination 11/04/2013 10:21 AM   Other:  Juline Patch, LCSW 11/04/2013 10:21 AM   Other:  Onnie Boer, RN case manager 11/04/2013 10:21 AM   Other:    Other:    Other:    Other:    Other:    Other:     Scribe for Treatment Team:   Carmina Miller, 11/04/2013 10:21 AM

## 2013-11-04 NOTE — Progress Notes (Signed)
Adult Psychoeducational Group Note  Date:  11/03/2013 Time:  8:00 pm  Group Topic/Focus: Wrap-Up Group:   The focus of this group is to help patients review their daily goal of treatment and discuss progress on daily workbooks.  Participation Level:  Active  Participation Quality:  Appropriate and Sharing  Affect:  Appropriate  Cognitive:  Appropriate  Insight: Appropriate  Engagement in Group:  Engaged  Modes of Intervention:  Discussion, Education, Socialization and Support  Additional Comments: Pt stated that she in the hospital due an emotional backslide. Pt identified that she is friendly and funny.   Jazz Rogala 11/04/2013, 4:07 AM

## 2013-11-04 NOTE — Progress Notes (Signed)
D:  Patient's self inventory sheet, patient has poor sleep, good appetite, low energy level, poor attention span.  Rated depression 6, hopeless 5, anxiety 4.  SI off/on, contracts for safety.  Denied physical problems.  Plans to continue medications.  "I am uncomfortable in gowns because I don't have anything on the bottom."  Will need financial assistance to purchase meds after discharge. A:  Medications administered per MD orders.  Emotional support and encouragement given patient. R:  Denied HI.  SI off/on, contracts for safety.  Denied A/V hallucinations.  Will continue to monitor patient for safety with 15 minute checks.  Safety maintained. Patient is homeless, staff attempting to find shelter/[facility] for patient after discharge from [facility].

## 2013-11-04 NOTE — [facility] Group Notes (Signed)
[facility] LCSW Aftercare Discharge Planning Group Note   11/04/2013  8:45 AM  Participation Quality:  Did Not Attend  Reyes Ivan, LCSW 11/04/2013 10:03 AM
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
## 2013-11-05 MED ORDER — NICOTINE 21 MG/24HR TD PT24
21.0000 mg | MEDICATED_PATCH | Freq: Every day | TRANSDERMAL | Status: DC
  Administered 2013-11-05: 21 mg via TRANSDERMAL
  Filled 2013-11-05 (×5): qty 1

## 2013-11-05 NOTE — Progress Notes (Signed)
Adult Psychoeducational Group Note  Date:  11/05/2013 Time:  11:00am Group Topic/Focus:  Rediscovering Joy:   The focus of this group is to explore various ways to relieve stress in a positive manner.  Participation Level:  Active  Participation Quality:  Appropriate and Attentive  Affect:  Appropriate  Cognitive:  Alert and Appropriate  Insight: Appropriate  Engagement in Group:  Engaged  Modes of Intervention:  Discussion and Education  Additional Comments:   Pt attended and participated in group. Discussion was on rediscovering joy. Patient discussed ways they could bring joy back into their lives by making simple choices and setting boundaries.    Shelly Bombard D 11/05/2013, 1:19 PM

## 2013-11-05 NOTE — Progress Notes (Signed)
D:Pt rates depression as a 3 on 1-10 scale with 10 being the most depressed. Pt did not eat breakfast this morning and c/o stomach pain. She reports that her sleep is fair with a normal energy level.  A:Gave Gingerale and encouraged pt to eat a small snack. Offered heat pack as pt reports that she may be close to starting her period. Pt refused heat pack stating that she was getting ready to take a warm shower. Encouraged pt to attend groups. R:Pt denies si and hi. Pt denies hallucinations. Safety maintained on the unit.

## 2013-11-05 NOTE — [facility] Group Notes (Signed)
[facility] LCSW Group Therapy  [facility] 1:15 - 2:30 PM  11/05/2013   Type of Therapy:  Group Therapy  Participation Level:  Active  Participation Quality:  Attentive  Affect:  Appropriate  Cognitive:  Appropriate  Insight:  Developing/Improving and Engaged  Engagement in Therapy:  Developing/Improving Engaged  Modes of Intervention:  Discussion, Education, Exploration, Problem-Solving, Rapport Building, Support   Summary of Progress/Problems:  Patient listened attentively to speaker from [facility].  She shared she is interested in the services should she remain in [place].  Wynn Banker 11/05/2013   \

## 2013-11-05 NOTE — Progress Notes (Signed)
Recreation Therapy Notes  Date: 11.05.2014 Time: 3:00pm Location: [address] Dayroom  Group Topic: Communication  Goal Area(s) Addresses:  Patient will effectively communicate with peers in group.  Patient will verbalize benefit of healthy communication. Patient will verbalize positive effect of healthy communication on post d/c goals.   Behavioral Response: Agitated, Engaged in activity.    Intervention: Game  Activity: Random Words. Patients were asked to select a word from the provided container. Using descriptors and/or actions patients were tasked with getting group members to guess the selected word.     Education: Communication, Discharge Planning   Education Outcome: Acknowledges understanding  Clinical Observations/Feedback: Patient actively engaged in group activity, accurately describing selected words for peers to guess. Patient made no contributions to group discussion, but appeared to actively listen as she maintained appropriate eye contact with speaker.   As group was starting LRT asked peer to get off the phone, as unit rules state no phone time during. Patient became very agitated accusing LRT of being disrespectful. LRT apologized for offending patient, patient continued to state that LRT was rude and disrespectful, refusing to let topic drop until LRT refused to engage in discussion any longer. Following group session LRT spoke with patient 1:1. Patient stated that staff "been testing me all day" and "you work here, so y'all think you can talk to [place] anyway you want." LRT ensure patient this was not the case. Patient continued to rehash incident with LRT, to which LRT reiterated that she did not mean to offend anyone. LRT additionally encouraged patient to talk to peer, who by his own report was not offended by LRT request. While conversation ended patient did not appeared satisfied with result.   Marykay Lex Tally Mckinnon, LRT/CTRS  Jearl Klinefelter 11/05/2013 9:37 AM

## 2013-11-05 NOTE — Progress Notes (Signed)
Patient ID: Jane Finley, female   DOB: 01-Sep-1990, 23 y.o.   MRN: [id number] [facility] MD Progress Note  11/05/2013 2:56 PM Jane Finley  MRN:  [id number]  Subjective:  Jane Finley is up and active in the unit milieu. She reports that she is better today and she can tell she is better because she is able to talk about the things that were bothering her. She states it is good to get things off her chest. She endorses decreasing depression and no SI/HI and feels that she will be able to discharge out tomorrow. She is considering the [employer]. Diagnosis:   DSM5: Schizophrenia Disorders:   Obsessive-Compulsive Disorders:   Trauma-Stressor Disorders:  Posttraumatic Stress Disorder (309.81) Substance/Addictive Disorders:  Cannabis Use Disorder - Mild (305.20) Depressive Disorders:  Major Depressive Disorder - Severe (296.23)  Axis I: Major Depression, Recurrent severe, Post Traumatic Stress Disorder, Substance Abuse and Substance Induced Mood Disorder  ADL's:  Impaired  Sleep: Fair  Appetite:  Fair  Suicidal Ideation:  Patient endorses suicidal thoughts and jumping out of the bridge and contracts for safety in the hospital Homicidal Ideation:  Denied AEB (as evidenced by):  Psychiatric Specialty Exam: ROS  Blood pressure 109/75, pulse 65, temperature 97.4 F (36.3 C), temperature source Oral, resp. rate 16, height 5' 2.21" (1.58 m), weight 95.709 kg (211 lb), last menstrual period 10/09/2013.Body mass index is 38.34 kg/(m^2).  General Appearance: casual  Eye Contact::  Minimal  Speech:  Clear and Coherent and Slow  Volume:  Decreased  Mood:  Improving and approaching euthymic  Affect:  More positive affect  Thought Process:  Goal Directed and Intact  Orientation:  Full (Time, Place, and Person)  Thought Content:  Paranoid Ideation and Rumination  Suicidal Thoughts:  Yes.  with intent/plan  Homicidal Thoughts:  No  Memory:  Immediate;   Fair  Judgement:  Impaired  Insight:   Lacking  Psychomotor Activity:  Psychomotor Retardation  Concentration:  Fair  Recall:  Fair  Akathisia:  NA  Handed:  Right  AIMS (if indicated):     Assets:  Communication Skills Desire for Improvement Physical Health Resilience Transportation  Sleep:  Number of Hours: 5.75   Current Medications: Current Facility-Administered Medications  Medication Dose Route Frequency Provider Last Rate Last Dose  . FLUoxetine (PROZAC) capsule 40 mg  40 mg Oral Daily Nehemiah Settle, MD   40 mg at 11/05/13 0851  . hydrOXYzine (ATARAX/VISTARIL) tablet 25 mg  25 mg Oral Q6H PRN Nehemiah Settle, MD      . magnesium hydroxide (MILK OF MAGNESIA) suspension 30 mL  30 mL Oral Daily PRN Court Joy, PA-C      . prazosin (MINIPRESS) capsule 2 mg  2 mg Oral QHS Nanine Means, NP   2 mg at 11/04/13 2103  . QUEtiapine (SEROQUEL) tablet 100 mg  100 mg Oral QHS Verne Spurr, PA-C   100 mg at 11/04/13 2104  . traZODone (DESYREL) tablet 100 mg  100 mg Oral QHS PRN Nehemiah Settle, MD   100 mg at 11/04/13 2103    Lab Results: No results found for this or any previous visit (from the past 48 hour(s)).  Physical Findings: AIMS: Facial and Oral Movements Muscles of Facial Expression: None, normal Lips and Perioral Area: None, normal Jaw: None, normal Tongue: None, normal,Extremity Movements Upper (arms, wrists, hands, fingers): None, normal Lower (legs, knees, ankles, toes): None, normal, Trunk Movements Neck, shoulders, hips: None, normal, Overall Severity Severity of abnormal  movements (highest score from questions above): None, normal Incapacitation due to abnormal movements: None, normal Patient's awareness of abnormal movements (rate only patient's report): No Awareness, Dental Status Current problems with teeth and/or dentures?: No Does patient usually wear dentures?: No  CIWA:  CIWA-Ar Total: 1 COWS:  COWS Total Score: 1  Treatment Plan Summary: Daily contact with  patient to assess and evaluate symptoms and progress in treatment Medication management  Plan: Continue Seroquel 100 mg at bedtime Hydroxyzine 25 mg every 6 hours for anxiety Continue Minipress 2 mg at bedtime Increase fluoxetine 40 mg daily Treatment Plan/Recommendations:  1. Continue crisis management and stabilization. 2. Medication management to reduce current symptoms to base line and     improve the patient's overall level of functioning. 3. Treat health problems as indicated. 4. Develop treatment plan to decrease risk of relapse upon discharge and to reduce the need for readmission. 5. Psycho-social education regarding relapse prevention and self care. 6. Health care follow up as needed for medical problems. 7. home medications where appropriate. 8. Disposition plans in progress.  9. ELOS: D/C in AM.  Medical Decision Making Problem Points:  Established problem, worsening (2), New problem, with no additional work-up planned (3), Review of last therapy session (1) and Review of psycho-social stressors (1) Data Points:  Review or order clinical lab tests (1) Review or order medicine tests (1) Review of medication regiment & side effects (2) Review of new medications or change in dosage (2)  I certify that inpatient services furnished can reasonably be expected to improve the patient's condition.   Rona Ravens. [place] RPAC 3:00 PM 11/05/2013  Reviewed the information documented and agree with the treatment plan.  Tabb Croghan,JANARDHAHA R. 11/06/2013 8:57 AM

## 2013-11-05 NOTE — Progress Notes (Signed)
Patient ID: Jane Finley, female   DOB: 1990/07/01, 23 y.o.   MRN: [id number] D: Pt. Visible on the unit, in dayroom and in hall interacting with other clients. Pt. Reports no depression, pt. Animated reports she talk to her 62 yo daughter tonight. Pt. Reports being here has been helpful "being able to relate to other people, sharing" A: Writer introduced self to client and provided emotional support. Staff will monitor q43min for safety. R: Pt. Is safe on the unit and attended karaoke.

## 2013-11-06 MED ORDER — PRAZOSIN HCL 2 MG PO CAPS: ORAL_CAPSULE | ORAL | Status: DC

## 2013-11-06 MED ORDER — TRAZODONE HCL 100 MG PO TABS: 100.0000 mg | ORAL_TABLET | Freq: Every evening | ORAL | Status: DC | PRN

## 2013-11-06 MED ORDER — FLUOXETINE HCL 40 MG PO CAPS: ORAL_CAPSULE | ORAL | Status: DC

## 2013-11-06 MED ORDER — QUETIAPINE FUMARATE 100 MG PO TABS: ORAL_TABLET | ORAL | Status: DC

## 2013-11-06 NOTE — Tx Team (Signed)
Interdisciplinary Treatment Plan Update (Adult)  Date: 11/06/2013  Time Reviewed:  9:45 AM  Progress in Treatment: Attending groups: Yes Participating in groups:  Yes Taking medication as prescribed:  Yes Tolerating medication:  Yes Family/Significant othe contact made: Yes, pt refused Patient understands diagnosis:  Yes Discussing patient identified problems/goals with staff:  Yes Medical problems stabilized or resolved:  Yes Denies suicidal/homicidal ideation: Yes Issues/concerns per patient self-inventory:  Yes Other:  New problem(s) identified: N/A  Discharge Plan or Barriers: Pt will follow up at [facility] for medication management and therapy.  Reason for Continuation of Hospitalization: Stable to d/c today  Comments: N/A  Estimated length of stay: D/C today  For review of initial/current patient goals, please see plan of care.  Attendees: Patient:  Jane Finley  11/06/2013 10:45 AM   Family:     Physician:  Dr. Javier Glazier 11/06/2013 10:45 AM   Nursing:   Lowella Grip, RN 11/06/2013 10:45 AM   Clinical Social Worker:  Reyes Ivan, LCSW 11/06/2013 10:45 AM   Other: Verne Spurr, PA 11/06/2013 10:45 AM   Other:  Frankey Shown, MA care coordination 11/06/2013 10:45 AM   Other:  Juline Patch, LCSW 11/06/2013 10:45 AM   Other:  Nestor Ramp, RN 11/06/2013 10:46 AM   Other:    Other:    Other:    Other:    Other:      Scribe for Treatment Team:   Carmina Miller, 11/06/2013 , 10:45 AM
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
## 2013-11-06 NOTE — Progress Notes (Signed)
Discharge Note: Discharge instructions/prescriptions/medication samples and bus pass given to patient. Patient verbalized understanding of discharge instructions and prescriptions. Returned belongings to patient. Denies SI/HI/AVH. Patient d/c without incident to the lobby and will be transported by [facility].
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
## 2013-11-06 NOTE — Progress Notes (Signed)
D:Pt has been in the bed all morning and has not attendee  any groups at this time. Writer went to pt's room with morning medication and woke pt. She was irritable at first and took her medication. Pt reports that she did not go to sleep until late last night. Before writer left the room, she thanked Clinical research associate for the medication, turned over and went back to sleep.      A:Offered support, medication and 15 minute checks. R:Pt denies si and hi. Safety maintained on the unit.

## 2013-11-06 NOTE — Progress Notes (Signed)
Adult Psychoeducational Group Note  Date:  11/06/2013 Time:  2:24 PM  Group Topic/Focus:  Early Warning Signs:   The focus of this group is to help patients identify signs or symptoms they exhibit before slipping into an unhealthy state or crisis.  Participation Level:  Minimal  Participation Quality:  Appropriate and Attentive  Affect:  Appropriate  Cognitive:  Alert and Appropriate  Insight: Good  Engagement in Group:  Engaged  Modes of Intervention:  Activity, Discussion, Exploration, Socialization and Support  Additional Comments:  Pt came to group and shared that drugs and not sleeping are two of her early warning signs for relapse. Pt plans on using showering and throwing a ball against a wall as coping skills to prevent relapse.   Cathlean Cower 11/06/2013, 2:24 PM

## 2013-11-06 NOTE — Progress Notes (Signed)
[facility] Adult Case Management Discharge Plan :  Will you be returning to the same living situation after discharge: Yes,  pt was homeless prior to admission and will continue to be homeless at d/c.  Pt verbalizes a plan to stay with a friend here in [place] before relocating to [facility] after her ID comes in.   PT has all information on [facility] and has been in contact with them about admission.   At discharge, do you have transportation home?:Yes,  provided pt with a bus pass Do you have the ability to pay for your medications:Yes,  access to meds  Release of information consent forms completed and in the chart;  Patient's signature needed at discharge.  Patient to Follow up at: Follow-up Information   Follow up with [facility] On 11/10/2013. (Walk in on this date for hospital discharge appointment.  Walk in clinic is Monday - Friday 8 am - 3 pm.  They will then schedule you for medication management and therapy.  )    Contact information:   [address] [place], [place] [address] Phone: [phone] Fax: [phone]      Patient denies SI/HI:   Yes,  denies SI/HI    Safety Planning and Suicide Prevention discussed:  Yes,  discussed with pt, pt refused consent to talk with family/friend.  See suicide prevention education note.   Carmina Miller 11/06/2013, 12:24 PM

## 2013-11-10 NOTE — Progress Notes (Signed)
Patient Discharge Instructions:  After Visit Summary (AVS):   Faxed to:  11/10/13 Discharge Summary Note:   Faxed to:  11/10/13 Psychiatric Admission Assessment Note:   Faxed to:  11/10/13 Suicide Risk Assessment - Discharge Assessment:   Faxed to:  11/10/13 Faxed/Sent to the Next Level Care provider:  11/10/13 Faxed to [facility] @ [phone]  Jerelene Redden, 11/10/2013, 3:29 PM

## 2018-02-01 ENCOUNTER — Encounter (HOSPITAL_COMMUNITY): Payer: Self-pay | Admitting: *Deleted

## 2018-02-01 ENCOUNTER — Emergency Department (HOSPITAL_COMMUNITY): Payer: Medicaid Other

## 2018-02-01 ENCOUNTER — Emergency Department (HOSPITAL_COMMUNITY)
Admission: EM | Admit: 2018-02-01 | Discharge: 2018-02-01 | Disposition: A | Discharge status: Home / Self Care | Payer: Medicaid Other | Attending: Emergency Medicine | Admitting: Emergency Medicine

## 2018-02-01 DIAGNOSIS — R059 Cough, unspecified: Secondary | ICD-10-CM

## 2018-02-01 DIAGNOSIS — R05 Cough: Secondary | ICD-10-CM | POA: Diagnosis present

## 2018-02-01 DIAGNOSIS — J111 Influenza due to unidentified influenza virus with other respiratory manifestations: Secondary | ICD-10-CM | POA: Diagnosis not present

## 2018-02-01 DIAGNOSIS — Z79899 Other long term (current) drug therapy: Secondary | ICD-10-CM | POA: Diagnosis not present

## 2018-02-01 DIAGNOSIS — F17210 Nicotine dependence, cigarettes, uncomplicated: Secondary | ICD-10-CM | POA: Insufficient documentation

## 2018-02-01 DIAGNOSIS — R69 Illness, unspecified: Secondary | ICD-10-CM

## 2018-02-01 MED ORDER — BENZONATATE 100 MG PO CAPS: 100.0000 mg | ORAL_CAPSULE | Freq: Three times a day (TID) | ORAL | 0 refills | Status: DC | PRN

## 2018-02-01 MED ORDER — IBUPROFEN 800 MG PO TABS: 800.0000 mg | ORAL_TABLET | Freq: Three times a day (TID) | ORAL | 0 refills | Status: DC | PRN

## 2018-02-01 NOTE — ED Provider Notes (Signed)
[facility] EMERGENCY DEPARTMENT Provider Note   CSN: [id number] Arrival date & time: 02/01/18  1191     History   Chief Complaint Chief Complaint  Patient presents with  . Cough  . Fever    HPI Aniylah Finley is a 28 y.o. female.  The history is provided by the patient and medical records. No language interpreter was used.  Cough  Associated symptoms include chills. Pertinent negatives include no sore throat and no shortness of breath.  Fever   Associated symptoms include congestion and cough. Pertinent negatives include no diarrhea, no vomiting and no sore throat.   Jane Finley is a 28 y.o. female  with a PMH as listed below who presents to the Emergency Department complaining of dry cough x 3 days associated with subjective nasal congestion, fever, chills and generalized body aches. She has been taking Alka-Seltzer cold medication with little relief. No chest pain, shortness of breath, abdominal pain, n/v/d. She is a daily smoker. Denies any hx of asthma or lung disease.    Past Medical History:  Diagnosis Date  . Anxiety   . Bipolar 1 disorder (HCC)   . Depression     Patient Active Problem List   Diagnosis Date Noted  . PTSD (post-traumatic stress disorder) 11/02/2013  . Depression with suicidal ideation 10/31/2013  . Polysubstance abuse (HCC) 10/31/2013  . Homeless 10/31/2013    History reviewed. No pertinent surgical history.  OB History    No data available       Home Medications    Prior to Admission medications   Medication Sig Start Date End Date Taking? Authorizing Provider  benzonatate (TESSALON) 100 MG capsule Take 1 capsule (100 mg total) by mouth 3 (three) times daily as needed for cough. 02/01/18   Jalexia Lalli, Chase Picket, PA-C  FLUoxetine (PROZAC) 40 MG capsule Take one capsule by mouth each day for depression. 11/06/13   Tamala Julian, PA-C  ibuprofen (ADVIL,MOTRIN) 800 MG tablet Take 1 tablet (800 mg total) by mouth every 8  (eight) hours as needed for fever, mild pain or moderate pain. 02/01/18   Lonita Debes, Chase Picket, PA-C  prazosin (MINIPRESS) 2 MG capsule Take one capsule at bedtime for sleep and nightmares. 11/06/13   Tamala Julian, PA-C  QUEtiapine (SEROQUEL) 100 MG tablet Take one tablet at bedtime for insomnia and depression. 11/06/13   Tamala Julian, PA-C  traZODone (DESYREL) 100 MG tablet Take 1 tablet (100 mg total) by mouth at bedtime as needed for sleep. 11/06/13   Tamala Julian, PA-C    Family History No family history on file.  Social History Social History   Tobacco Use  . Smoking status: Current Every Day Smoker    Types: Cigarettes  Substance Use Topics  . Alcohol use: Yes  . Drug use: Yes    Types: Marijuana     Allergies   Patient has no known allergies.   Review of Systems Review of Systems  Constitutional: Positive for chills and fever (Subjective).  HENT: Positive for congestion. Negative for sore throat.   Respiratory: Positive for cough. Negative for shortness of breath.   Gastrointestinal: Negative for abdominal pain, blood in stool, diarrhea, nausea and vomiting.     Physical Exam Updated Vital Signs BP 114/74 (BP Location: Right Arm)   Pulse 79   Temp 99.6 F (37.6 C) (Oral)   Resp 18   Ht 5\' 2"  (1.575 m)   Wt 82.6 kg (182 lb)   LMP  01/24/2018 (Approximate)   SpO2 97%   BMI 33.29 kg/m   Physical Exam  Constitutional: She is oriented to person, place, and time. She appears well-developed and well-nourished. No distress.  HENT:  Head: Normocephalic and atraumatic.  OP with erythema, no exudates or tonsillar hypertrophy. + nasal congestion with mucosal edema. No focal area of sinus tenderness.  Neck: Normal range of motion. Neck supple.  Cardiovascular: Normal rate, regular rhythm and normal heart sounds.  Pulmonary/Chest: Effort normal.  Lungs are clear to auscultation bilaterally - no w/r/r  Abdominal: Soft. She exhibits no distension. There is no  tenderness.  Musculoskeletal: Normal range of motion.  Neurological: She is alert and oriented to person, place, and time.  Skin: Skin is warm and dry. She is not diaphoretic.  Nursing note and vitals reviewed.    ED Treatments / Results  Labs (all labs ordered are listed, but only abnormal results are displayed) Labs Reviewed - No data to display  EKG  EKG Interpretation None       Radiology Dg Chest 2 View  Result Date: 02/01/2018 CLINICAL DATA:  Body aches and chills since yesterday. EXAM: CHEST  2 VIEW COMPARISON:  None available. FINDINGS: Cardiomediastinal silhouette is normal. Mediastinal contours appear intact. There is no evidence of focal airspace consolidation, pleural effusion or pneumothorax. Osseous structures are without acute abnormality. Soft tissues are grossly normal. IMPRESSION: No active cardiopulmonary disease. Electronically Signed   By: Ted Mcalpineobrinka  Dimitrova M.D.   On: 02/01/2018 11:24    Procedures Procedures (including critical care time)  Medications Ordered in ED Medications - No data to display   Initial Impression / Assessment and Plan / ED Course  I have reviewed the triage vital signs and the nursing notes.  Pertinent labs & imaging results that were available during my care of the patient were reviewed by me and considered in my medical decision making (see chart for details).    Jane Finley is a 28 y.o. female who presents to ED for URI symptoms x 3 days. .   On exam, patient is afebrile, non-toxic appearing with a clear lung exam. Mild rhinorrhea and OP with erythema but no exudates or tonsillar hypertrophy.  CXR negative.   Sxs today likely due to viral URI.Symptomatic home care instructions discussed. Rx for tessalon, ibuprofen given. PCP follow up strongly encouraged if symptoms persist. Reasons to return to ER discussed. All questions answered.   Blood pressure 114/74, pulse 79, temperature 99.6 F (37.6 C), temperature source  Oral, resp. rate 18, height 5\' 2"  (1.575 m), weight 82.6 kg (182 lb), last menstrual period 01/24/2018, SpO2 97 %.   Final Clinical Impressions(s) / ED Diagnoses   Final diagnoses:  Cough  Influenza-like illness    ED Discharge Orders        Ordered    ibuprofen (ADVIL,MOTRIN) 800 MG tablet  Every 8 hours PRN     02/01/18 1138    benzonatate (TESSALON) 100 MG capsule  3 times daily PRN     02/01/18 1138       Janyce Ellinger, Chase PicketJaime Pilcher, PA-C 02/01/18 1142    Eber HongMiller, Brian, MD 02/02/18 [phone]

## 2018-02-01 NOTE — ED Triage Notes (Signed)
To ED for eval of body aches and chills since yesterday. Cough started Thursday. OTC meds taken with relief- Alka seltzer . States 'I feel horrible'. Resp e/u. Skin w/d.

## 2018-02-01 NOTE — Discharge Instructions (Signed)
It was my pleasure taking care of you today!  Tessalon as needed for cough. Alternate between Tylenol and ibuprofen as needed for body aches / fevers. Rest, drink plenty of fluids to stay hydrated. Wash your hands often.  Follow up with your doctor in regards to your hospital visit. Return to the emergency department if symptoms worsen, become progressive, or become more concerning.  GET HELP RIGHT AWAY IF:  You have shortness of breath while resting.  You have pain or pressure in the chest You have a hard time breathing.  You get a bad neck pain or stiffness.  You throw up (vomit) a lot and can't keep fluids down. You have a fever > 101 that persists despite ibuprofen or tylenol  MAKE SURE YOU:  Understand these instructions.  Will watch your condition.  Will get help right away if you are not doing well or get worse.

## 2018-02-01 NOTE — ED Notes (Signed)
Declined W/C at D/C and was escorted to lobby by RN. 

## 2018-03-16 ENCOUNTER — Inpatient Hospital Stay (HOSPITAL_COMMUNITY): Payer: Medicaid Other | Admitting: Anesthesiology

## 2018-03-16 ENCOUNTER — Encounter (HOSPITAL_COMMUNITY)
Admission: EM | Disposition: A | Discharge status: Home / Self Care | Payer: Self-pay | Source: Home / Self Care | Attending: Emergency Medicine

## 2018-03-16 ENCOUNTER — Other Ambulatory Visit: Payer: Self-pay

## 2018-03-16 ENCOUNTER — Encounter (HOSPITAL_COMMUNITY): Payer: Self-pay

## 2018-03-16 ENCOUNTER — Observation Stay (HOSPITAL_COMMUNITY)
Admission: EM | Admit: 2018-03-16 | Discharge: 2018-03-17 | Disposition: A | Discharge status: Home / Self Care | Payer: Medicaid Other | Attending: Obstetrics & Gynecology | Admitting: Obstetrics & Gynecology

## 2018-03-16 ENCOUNTER — Emergency Department (HOSPITAL_COMMUNITY): Payer: Medicaid Other

## 2018-03-16 DIAGNOSIS — O00109 Unspecified tubal pregnancy without intrauterine pregnancy: Secondary | ICD-10-CM | POA: Diagnosis present

## 2018-03-16 DIAGNOSIS — F17210 Nicotine dependence, cigarettes, uncomplicated: Secondary | ICD-10-CM | POA: Diagnosis not present

## 2018-03-16 DIAGNOSIS — O26891 Other specified pregnancy related conditions, first trimester: Secondary | ICD-10-CM

## 2018-03-16 DIAGNOSIS — F319 Bipolar disorder, unspecified: Secondary | ICD-10-CM | POA: Insufficient documentation

## 2018-03-16 DIAGNOSIS — E669 Obesity, unspecified: Secondary | ICD-10-CM | POA: Diagnosis not present

## 2018-03-16 DIAGNOSIS — O00101 Right tubal pregnancy without intrauterine pregnancy: Principal | ICD-10-CM | POA: Insufficient documentation

## 2018-03-16 DIAGNOSIS — F4310 Post-traumatic stress disorder, unspecified: Secondary | ICD-10-CM | POA: Diagnosis not present

## 2018-03-16 DIAGNOSIS — O00201 Right ovarian pregnancy without intrauterine pregnancy: Secondary | ICD-10-CM

## 2018-03-16 DIAGNOSIS — O26899 Other specified pregnancy related conditions, unspecified trimester: Secondary | ICD-10-CM

## 2018-03-16 DIAGNOSIS — R102 Pelvic and perineal pain: Secondary | ICD-10-CM

## 2018-03-16 DIAGNOSIS — F419 Anxiety disorder, unspecified: Secondary | ICD-10-CM | POA: Diagnosis not present

## 2018-03-16 HISTORY — PX: LAPAROSCOPIC UNILATERAL SALPINGECTOMY: SHX5934

## 2018-03-16 HISTORY — PX: LAPAROSCOPY: SHX197

## 2018-03-16 LAB — COMPREHENSIVE METABOLIC PANEL
ALT: 17 U/L (ref 14–54)
ANION GAP: 6 (ref 5–15)
AST: 22 U/L (ref 15–41)
Albumin: 3.5 g/dL (ref 3.5–5.0)
Alkaline Phosphatase: 54 U/L (ref 38–126)
BUN: 9 mg/dL (ref 6–20)
CO2: 23 mmol/L (ref 22–32)
Calcium: 8.6 mg/dL — ABNORMAL LOW (ref 8.9–10.3)
Chloride: 105 mmol/L (ref 101–111)
Creatinine, Ser: 0.59 mg/dL (ref 0.44–1.00)
GFR calc non Af Amer: >60 mL/min (ref >60)
Glucose, Bld: 93 mg/dL (ref 65–99)
Potassium: 3.8 mmol/L (ref 3.5–5.1)
SODIUM: 134 mmol/L — AB (ref 135–145)
Total Bilirubin: 0.4 mg/dL (ref 0.3–1.2)
Total Protein: 6.2 g/dL — ABNORMAL LOW (ref 6.5–8.1)

## 2018-03-16 LAB — TYPE AND SCREEN
ABO/RH(D): O POS
ABO/RH(D): O POS
Antibody Screen: NEGATIVE
Antibody Screen: NEGATIVE

## 2018-03-16 LAB — URINALYSIS, ROUTINE W REFLEX MICROSCOPIC
Bilirubin Urine: NEGATIVE
Glucose, UA: NEGATIVE mg/dL
Hgb urine dipstick: NEGATIVE
Ketones, ur: 5 mg/dL — AB
Leukocytes, UA: NEGATIVE
Nitrite: NEGATIVE
Protein, ur: NEGATIVE mg/dL
RBC / HPF: NONE SEEN RBC/hpf (ref 0–5)
Specific Gravity, Urine: 1.016 (ref 1.005–1.030)
pH: 7 (ref 5.0–8.0)

## 2018-03-16 LAB — WET PREP, GENITAL
CLUE CELLS WET PREP: NONE SEEN
Sperm: NONE SEEN
Trich, Wet Prep: NONE SEEN
YEAST WET PREP: NONE SEEN

## 2018-03-16 LAB — CBC
HCT: 37.9 % (ref 36.0–46.0)
HEMOGLOBIN: 12.6 g/dL (ref 12.0–15.0)
MCH: 29.9 pg (ref 26.0–34.0)
MCHC: 33.2 g/dL (ref 30.0–36.0)
MCV: 90 fL (ref 78.0–100.0)
Platelets: 249 10*3/uL (ref 150–400)
RBC: 4.21 MIL/uL (ref 3.87–5.11)
RDW: 14 % (ref 11.5–15.5)
WBC: 11.2 10*3/uL — ABNORMAL HIGH (ref 4.0–10.5)

## 2018-03-16 LAB — HCG, QUANTITATIVE, PREGNANCY: hCG, Beta Chain, Quant, S: 24671 m[IU]/mL — ABNORMAL HIGH (ref <5)

## 2018-03-16 LAB — LIPASE, BLOOD: Lipase: 28 U/L (ref 11–51)

## 2018-03-16 LAB — ABO/RH: ABO/RH(D): O POS

## 2018-03-16 LAB — I-STAT BETA HCG BLOOD, ED (MC, WL, AP ONLY): I-stat hCG, quantitative: >2000 m[IU]/mL — ABNORMAL HIGH (ref <5)

## 2018-03-16 SURGERY — LAPAROSCOPY OPERATIVE
Anesthesia: General | Site: Abdomen | Laterality: Right

## 2018-03-16 MED ORDER — FENTANYL CITRATE (PF) 250 MCG/5ML IJ SOLN
INTRAMUSCULAR | Status: AC
  Filled 2018-03-16: qty 5

## 2018-03-16 MED ORDER — ONDANSETRON HCL 4 MG/2ML IJ SOLN
INTRAMUSCULAR | Status: AC
  Filled 2018-03-16: qty 2

## 2018-03-16 MED ORDER — SODIUM CHLORIDE 0.9 % IV BOLUS (SEPSIS)
1000.0000 mL | Freq: Once | INTRAVENOUS | Status: AC
  Administered 2018-03-16: 1000 mL via INTRAVENOUS

## 2018-03-16 MED ORDER — LACTATED RINGERS IV SOLN
INTRAVENOUS | Status: DC
  Administered 2018-03-16 – 2018-03-17 (×2): via INTRAVENOUS

## 2018-03-16 MED ORDER — ONDANSETRON HCL 4 MG/2ML IJ SOLN
4.0000 mg | Freq: Once | INTRAMUSCULAR | Status: AC
  Administered 2018-03-16: 4 mg via INTRAVENOUS
  Filled 2018-03-16: qty 2

## 2018-03-16 MED ORDER — DEXAMETHASONE SODIUM PHOSPHATE 10 MG/ML IJ SOLN
INTRAMUSCULAR | Status: AC
  Filled 2018-03-16: qty 1

## 2018-03-16 MED ORDER — PROPOFOL 10 MG/ML IV BOLUS
INTRAVENOUS | Status: AC
  Filled 2018-03-16: qty 20

## 2018-03-16 MED ORDER — MIDAZOLAM HCL 2 MG/2ML IJ SOLN
INTRAMUSCULAR | Status: AC
  Filled 2018-03-16: qty 2

## 2018-03-16 MED ORDER — LIDOCAINE HCL (CARDIAC) 20 MG/ML IV SOLN
INTRAVENOUS | Status: AC
  Filled 2018-03-16: qty 5

## 2018-03-16 MED ORDER — ROCURONIUM BROMIDE 100 MG/10ML IV SOLN
INTRAVENOUS | Status: AC
  Filled 2018-03-16: qty 1

## 2018-03-16 SURGICAL SUPPLY — 39 items
ADH SKN CLS APL DERMABOND .7 (GAUZE/BANDAGES/DRESSINGS)
BAG SPEC RTRVL LRG 6X4 10 (ENDOMECHANICALS) ×2
BARRIER ADHS 3X4 INTERCEED (GAUZE/BANDAGES/DRESSINGS) IMPLANT
BRR ADH 4X3 ABS CNTRL BYND (GAUZE/BANDAGES/DRESSINGS)
COVER MAYO STAND STRL (DRAPES) ×4 IMPLANT
DECANTER SPIKE VIAL GLASS SM (MISCELLANEOUS) ×4 IMPLANT
DERMABOND ADVANCED (GAUZE/BANDAGES/DRESSINGS)
DERMABOND ADVANCED .7 DNX12 (GAUZE/BANDAGES/DRESSINGS) IMPLANT
DRSG OPSITE POSTOP 3X4 (GAUZE/BANDAGES/DRESSINGS) ×2 IMPLANT
ELECT REM PT RETURN 9FT ADLT (ELECTROSURGICAL) ×4
ELECTRODE REM PT RTRN 9FT ADLT (ELECTROSURGICAL) ×2 IMPLANT
FILTER SMOKE EVAC LAPAROSHD (FILTER) IMPLANT
GLOVE BIOGEL PI IND STRL 7.0 (GLOVE) ×2 IMPLANT
GLOVE BIOGEL PI IND STRL 8 (GLOVE) ×2 IMPLANT
GLOVE BIOGEL PI INDICATOR 7.0 (GLOVE) ×2
GLOVE BIOGEL PI INDICATOR 8 (GLOVE) ×2
GLOVE ECLIPSE 8.0 STRL XLNG CF (GLOVE) ×4 IMPLANT
GOWN STRL REUS W/TWL LRG LVL3 (GOWN DISPOSABLE) ×8 IMPLANT
NEEDLE INSUFFLATION 120MM (ENDOMECHANICALS) ×4 IMPLANT
PACK LAPAROSCOPY II (CUSTOM PROCEDURE TRAY) ×4 IMPLANT
PACK TRENDGUARD 450 HYBRID PRO (MISCELLANEOUS) IMPLANT
PACK TRENDGUARD 600 HYBRD PROC (MISCELLANEOUS) IMPLANT
POUCH SPECIMEN RETRIEVAL 10MM (ENDOMECHANICALS) ×2 IMPLANT
PROTECTOR NERVE ULNAR (MISCELLANEOUS) ×8 IMPLANT
SET IRRIG TUBING LAPAROSCOPIC (IRRIGATION / IRRIGATOR) ×2 IMPLANT
SHEARS HARMONIC ACE PLUS 36CM (ENDOMECHANICALS) ×2 IMPLANT
SLEEVE XCEL OPT CAN 5 100 (ENDOMECHANICALS) ×4 IMPLANT
STAPLER VISISTAT 35W (STAPLE) ×2 IMPLANT
SUT VIC AB 3-0 FS2 27 (SUTURE) ×2 IMPLANT
SUT VIC AB 3-0 SH 27 (SUTURE) ×4
SUT VIC AB 3-0 SH 27X BRD (SUTURE) IMPLANT
SUT VICRYL 0 UR6 27IN ABS (SUTURE) ×2 IMPLANT
TOWEL OR 17X24 6PK STRL BLUE (TOWEL DISPOSABLE) ×8 IMPLANT
TRAY FOLEY CATH SILVER 14FR (SET/KITS/TRAYS/PACK) ×4 IMPLANT
TRENDGUARD 450 HYBRID PRO PACK (MISCELLANEOUS) ×4
TRENDGUARD 600 HYBRID PROC PK (MISCELLANEOUS)
TROCAR BALLN 12MMX100 BLUNT (TROCAR) IMPLANT
TROCAR XCEL NON-BLD 11X100MML (ENDOMECHANICALS) ×4 IMPLANT
TROCAR XCEL NON-BLD 5MMX100MML (ENDOMECHANICALS) ×4 IMPLANT

## 2018-03-16 NOTE — ED Provider Notes (Signed)
[facility]-EMERGENCY DEPT Provider Note   CSN: [id number] Arrival date & time: 03/16/18  1319     History   Chief Complaint Chief Complaint  Patient presents with  . Pelvic Pain  . Nausea    HPI Jane Finley is a 28 y.o. female hx of bipolar, depression, here with vaginal bleeding. Her last menstrual cycle was beginning of February. She is sexually active. For the last two weeks she has intermittent lower abdominal pain and vaginal spotting. She also felt nauseated as well. She denies fever or vaginal bleeding. She denies being pregnant before.   The history is provided by the patient.    Past Medical History:  Diagnosis Date  . Anxiety   . Bipolar 1 disorder (HCC)   . Depression     Patient Active Problem List   Diagnosis Date Noted  . PTSD (post-traumatic stress disorder) 11/02/2013  . Depression with suicidal ideation 10/31/2013  . Polysubstance abuse (HCC) 10/31/2013  . Homeless 10/31/2013    History reviewed. No pertinent surgical history.  OB History    No data available       Home Medications    Prior to Admission medications   Medication Sig Start Date End Date Taking? Authorizing Provider  ibuprofen (ADVIL,MOTRIN) 800 MG tablet Take 1 tablet (800 mg total) by mouth every 8 (eight) hours as needed for fever, mild pain or moderate pain. 02/01/18  Yes Ward, Chase Picket, PA-C  benzonatate (TESSALON) 100 MG capsule Take 1 capsule (100 mg total) by mouth 3 (three) times daily as needed for cough. Patient not taking: Reported on 03/16/2018 02/01/18   Ward, Chase Picket, PA-C    Family History History reviewed. No pertinent family history.  Social History Social History   Tobacco Use  . Smoking status: Current Every Day Smoker    Types: Cigarettes  Substance Use Topics  . Alcohol use: Yes  . Drug use: Yes    Types: Marijuana     Allergies   Patient has no known allergies.   Review of Systems Review of Systems    Genitourinary: Positive for pelvic pain and vaginal bleeding.  All other systems reviewed and are negative.    Physical Exam Updated Vital Signs BP 124/70   Pulse 78   Temp 98.4 F (36.9 C) (Oral)   Resp 18   SpO2 100%   Physical Exam  Constitutional: She is oriented to person, place, and time.  Anxious   HENT:  Head: Normocephalic.  Eyes: Conjunctivae and EOM are normal. Pupils are equal, round, and reactive to light.  Neck: Normal range of motion. Neck supple.  Cardiovascular: Normal rate, regular rhythm and normal heart sounds.  Pulmonary/Chest: Effort normal and breath sounds normal. No stridor. No respiratory distress.  Abdominal: Soft. Bowel sounds are normal. She exhibits no distension. There is no tenderness.  Genitourinary:  Genitourinary Comments: Whitish discharge, mild R adnexal tenderness, os closed   Musculoskeletal: Normal range of motion.  Neurological: She is alert and oriented to person, place, and time.  Skin: Skin is warm.  Psychiatric: She has a normal mood and affect.  Nursing note and vitals reviewed.    ED Treatments / Results  Labs (all labs ordered are listed, but only abnormal results are displayed) Labs Reviewed  COMPREHENSIVE METABOLIC PANEL - Abnormal; Notable for the following components:      Result Value   Sodium 134 (*)    Calcium 8.6 (*)    Total Protein 6.2 (*)  All other components within normal limits  CBC - Abnormal; Notable for the following components:   WBC 11.2 (*)    All other components within normal limits  I-STAT BETA HCG BLOOD, ED ([facility], [facility], [facility] ONLY) - Abnormal; Notable for the following components:   I-stat hCG, quantitative >2,000.0 (*)    All other components within normal limits  WET PREP, GENITAL  LIPASE, BLOOD  URINALYSIS, ROUTINE W REFLEX MICROSCOPIC  HCG, QUANTITATIVE, PREGNANCY  TYPE AND SCREEN  GC/CHLAMYDIA PROBE AMP () NOT AT [facility]    EKG  EKG Interpretation None       Radiology US  Ob Less Than 14 Weeks With Ob Transvaginal  Result Date: 03/16/2018 CLINICAL DATA:  Pelvic pain affecting pregnancy in the first trimester. EXAM: OBSTETRIC <14 WK [place] AND TRANSVAGINAL OB US TECHNIQUE: Both transabdominal and transvaginal ultrasound examinations were performed for complete evaluation of the gestation as well as the maternal uterus, adnexal regions, and pelvic cul-de-sac. Transvaginal technique was performed to assess early pregnancy. COMPARISON:  None. FINDINGS: Intrauterine gestational sac: None. Extra uterine gestational sac is identified just lateral to the right ovary. Yolk sac:  Visualized. Embryo:  Visualized. Cardiac Activity: Visualized. Heart Rate: 152 bpm CRL:  4.7 mm   6 w   1 d                  [place] EDC: 11/08/2018 Subchorionic hemorrhage:  None visualized. Maternal uterus/adnexae: Right ovary measured 5 x 2.9 x 3.4 cm. The left ovary was not visualized. Hypoechoic fluid compatible with blood products noted in the endometrium which measured 8.2 cm thickness. Small to moderate volume of complex fluid in the cul-de-sac with clot like material is also noted. IMPRESSION: Viable 6 week 1 day ectopic pregnancy in the right adnexa adjacent to the right ovary with hypoechoic complex fluid in the endometrial cavity compatible with blood products. Similarly, clot like material is seen in the free fluid within the cul-de-sac. These results were called by telephone at the time of interpretation on 03/16/2018 at 8:45 pm to Dr. Derwood Kaplan , who verbally acknowledged these results. Electronically Signed   By: Tollie Eth M.D.   On: 03/16/2018 20:45    Procedures Procedures (including critical care time)  CRITICAL CARE Performed by: Richardean Canal   Total critical care time: 30 minutes  Critical care time was exclusive of separately billable procedures and treating other patients.  Critical care was necessary to treat or prevent imminent or life-threatening deterioration.  Critical care  was time spent personally by me on the following activities: development of treatment plan with patient and/or surrogate as well as nursing, discussions with consultants, evaluation of patient's response to treatment, examination of patient, obtaining history from patient or surrogate, ordering and performing treatments and interventions, ordering and review of laboratory studies, ordering and review of radiographic studies, pulse oximetry and re-evaluation of patient's condition.   Medications Ordered in ED Medications  sodium chloride 0.9 % bolus 1,000 mL (1,000 mLs Intravenous New Bag/Given 03/16/18 2033)  ondansetron [facility]) injection 4 mg (4 mg Intravenous Given 03/16/18 2033)     Initial Impression / Assessment and Plan / ED Course  I have reviewed the triage vital signs and the nursing notes.  Pertinent labs & imaging results that were available during my care of the patient were reviewed by me and considered in my medical decision making (see chart for details).    Jane Finley is a 28 y.o. female here with vaginal  bleeding, pelvic pain. HCG > 2000. Likely threatened abortion vs ectopic. Will get labs, Rh, HCG quant. Will hydrate and reassess.   9:13 PM Patient has R ectopic with heart beat. There is free fluid. HCG quant pending. Rh pending. I [facility] Des[facility] I updated patient regarding the transfer. Stable for transfer.    Final Clinical Impressions(s) / ED Diagnoses   Final diagnoses:  Right ovarian pregnancy without intrauterine pregnancy    ED Discharge Orders    None       Charlynne PanderYao, Jarid Sasso Hsienta, MD 03/16/18 2114

## 2018-03-16 NOTE — ED Notes (Signed)
Pelvic cart at bedside. 

## 2018-03-16 NOTE — MAU Note (Signed)
[facility] with confirmed ectopic pregnancy.

## 2018-03-16 NOTE — Anesthesia Preprocedure Evaluation (Addendum)
Anesthesia Evaluation  Patient identified by MRN, date of birth, ID band Patient awake    Reviewed: Allergy & Precautions, NPO status , Patient's Chart, lab work & pertinent test results  Airway Mallampati: II  TM Distance: >3 FB Neck ROM: Full    Dental no notable dental hx. (+) Teeth Intact   Pulmonary Current Smoker,    Pulmonary exam normal breath sounds clear to auscultation       Cardiovascular negative cardio ROS Normal cardiovascular exam Rhythm:Regular Rate:Normal     Neuro/Psych PSYCHIATRIC DISORDERS Anxiety Depression Bipolar Disorder PTSDnegative neurological ROS     GI/Hepatic negative GI ROS, (+)     substance abuse  , Hx/o polysubstance abuse   Endo/Other  Obesity  Renal/GU negative Renal ROS  negative genitourinary   Musculoskeletal negative musculoskeletal ROS (+)   Abdominal (+) + obese,  Abdomen: tender.    Peds  Hematology negative hematology ROS (+)   Anesthesia Other Findings   Reproductive/Obstetrics (+) Pregnancy Right ectopic pregnancy                            Anesthesia Physical Anesthesia Plan  ASA: II and emergent  Anesthesia Plan: General   Post-op Pain Management:    Induction: Intravenous, Rapid sequence and Cricoid pressure planned  PONV Risk Score and Plan: 4 or greater and Scopolamine patch - Pre-op, Midazolam, Dexamethasone, Ondansetron and Treatment may vary due to age or medical condition  Airway Management Planned: Oral ETT  Additional Equipment:   Intra-op Plan:   Post-operative Plan: Extubation in OR  Informed Consent: I have reviewed the patients History and Physical, chart, labs and discussed the procedure including the risks, benefits and alternatives for the proposed anesthesia with the patient or authorized representative who has indicated his/her understanding and acceptance.   Dental advisory given  Plan Discussed  with: CRNA, Anesthesiologist and Surgeon  Anesthesia Plan Comments:         Anesthesia Quick Evaluation

## 2018-03-16 NOTE — ED Triage Notes (Signed)
Pt c/o pelvic pain and bleeding x2 weeks. She is also complaining of nausea at this time.

## 2018-03-16 NOTE — ED Notes (Signed)
Pt states "I am getting pissed off about this wait" informed pt we are getting pts back as soon as we can. Pt outside to smoke at this time with family.

## 2018-03-16 NOTE — ED Notes (Signed)
Called for triage. Registration let us know that she told them she was going to smoke a cigarette and would be back,.

## 2018-03-16 NOTE — ED Notes (Signed)
Bed: WA06 Expected date:  Expected time:  Means of arrival:  Comments: 

## 2018-03-16 NOTE — ED Notes (Signed)
Pt aware of urine sample needed.

## 2018-03-16 NOTE — ED Notes (Signed)
Per EMS, patient coming from work, c/o pelvic pain intermittent x3 weeks. Reports vaginal bleeding x13 days. LMP in February. Hx ovarian cyst.   BP 117/55 HR 66 O2 100%

## 2018-03-16 NOTE — H&P (Signed)
Preoperative History and Physical  Jane Finley is a 28 y.o. G3P1011 with Patient's last menstrual period was 01/24/2018 (approximate). admitted for a laparoscopic operative management of right ectopic pregnancy [redacted]w[redacted]d by CRL with +FCA.  Pt presented to [facility] ED with pelvic pain and work up included HCG which was positive, pt has not been able to get pregnant for about 6 years Sonogram reveals the [redacted]w[redacted]d CRL +FCA ectopic pregnancy on the right  Pt was stable and transferred over from [facility] ED to [facility] for operative management  PMH:    Past Medical History:  Diagnosis Date  . Anxiety   . Bipolar 1 disorder (HCC)   . Depression     PSH:    History reviewed. No pertinent surgical history.  POb/GynH:      OB History    Gravida Para Term Preterm AB Living   3 1 1   1 1    SAB TAB Ectopic Multiple Live Births   1       1      SH:   Social History   Tobacco Use  . Smoking status: Current Every Day Smoker    Types: Cigarettes  Substance Use Topics  . Alcohol use: Yes  . Drug use: Yes    Types: Marijuana    FH:   History reviewed. No pertinent family history.   Allergies: No Known Allergies  Medications:       Current Facility-Administered Medications:  .  lactated ringers infusion, , Intravenous, Continuous, Lazaro Arms, MD, Last Rate: 125 mL/hr at 03/16/18 2220  Review of Systems:   Review of Systems  Constitutional: Negative for fever, chills, weight loss, malaise/fatigue and diaphoresis.  HENT: Negative for hearing loss, ear pain, nosebleeds, congestion, sore throat, neck pain, tinnitus and ear discharge.   Eyes: Negative for blurred vision, double vision, photophobia, pain, discharge and redness.  Respiratory: Negative for cough, hemoptysis, sputum production, shortness of breath, wheezing and stridor.   Cardiovascular: Negative for chest pain, palpitations, orthopnea, claudication, leg swelling and PND.  Gastrointestinal: Positive for abdominal pain. Negative for  heartburn, nausea, vomiting, diarrhea, constipation, blood in stool and melena.  Genitourinary: Negative for dysuria, urgency, frequency, hematuria and flank pain.  Musculoskeletal: Negative for myalgias, back pain, joint pain and falls.  Skin: Negative for itching and rash.  Neurological: Negative for dizziness, tingling, tremors, sensory change, speech change, focal weakness, seizures, loss of consciousness, weakness and headaches.  Endo/Heme/Allergies: Negative for environmental allergies and polydipsia. Does not bruise/bleed easily.  Psychiatric/Behavioral: Negative for depression, suicidal ideas, hallucinations, memory loss and substance abuse. The patient is not nervous/anxious and does not have insomnia.      PHYSICAL EXAM:  Blood pressure 139/76, pulse 80, temperature 98.6 F (37 C), temperature source Oral, resp. rate 18, height 5\' 2"  (1.575 m), weight 187 lb (84.8 kg), last menstrual period 01/24/2018, SpO2 100 %.    Vitals reviewed. Constitutional: She is oriented to person, place, and time. She appears well-developed and well-nourished.  HENT:  Head: Normocephalic and atraumatic.  Right Ear: External ear normal.  Left Ear: External ear normal.  Nose: Nose normal.  Mouth/Throat: Oropharynx is clear and moist.  Eyes: Conjunctivae and EOM are normal. Pupils are equal, round, and reactive to light. Right eye exhibits no discharge. Left eye exhibits no discharge. No scleral icterus.  Neck: Normal range of motion. Neck supple. No tracheal deviation present. No thyromegaly present.  Cardiovascular: Normal rate, regular rhythm, normal heart sounds and intact distal pulses.  Exam  reveals no gallop and no friction rub.   No murmur heard. Respiratory: Effort normal and breath sounds normal. No respiratory distress. She has no wheezes. She has no rales. She exhibits no tenderness.  GI: Soft. Bowel sounds are normal. She exhibits no distension and no mass. There is tenderness. There is no  rebound and no guarding.  Genitourinary:       Vulva is normal without lesions Vagina is pink moist without discharge Cervix normal in appearance and pap is normal Uterus per sonogram below Adnexa per sonogram  Musculoskeletal: Normal range of motion. She exhibits no edema and no tenderness.  Neurological: She is alert and oriented to person, place, and time. She has normal reflexes. She displays normal reflexes. No cranial nerve deficit. She exhibits normal muscle tone. Coordination normal.  Skin: Skin is warm and dry. No rash noted. No erythema. No pallor.  Psychiatric: She has a normal mood and affect. Her behavior is normal. Judgment and thought content normal.    Labs: Results for orders placed or performed during the hospital encounter of 03/16/18 (from the past 336 hour(s))  Lipase, blood   Collection Time: 03/16/18  3:24 PM  Result Value Ref Range   Lipase 28 11 - 51 U/L  Comprehensive metabolic panel   Collection Time: 03/16/18  3:24 PM  Result Value Ref Range   Sodium 134 (L) 135 - 145 mmol/L   Potassium 3.8 3.5 - 5.1 mmol/L   Chloride 105 101 - 111 mmol/L   CO2 23 22 - 32 mmol/L   Glucose, Bld 93 65 - 99 mg/dL   BUN 9 6 - 20 mg/dL   Creatinine, Ser 4.090.59 0.44 - 1.00 mg/dL   Calcium 8.6 (L) 8.9 - 10.3 mg/dL   Total Protein 6.2 (L) 6.5 - 8.1 g/dL   Albumin 3.5 3.5 - 5.0 g/dL   AST 22 15 - 41 U/L   ALT 17 14 - 54 U/L   Alkaline Phosphatase 54 38 - 126 U/L   Total Bilirubin 0.4 0.3 - 1.2 mg/dL   GFR calc non Af Amer >60 >60 mL/min   GFR calc Af Amer >60 >60 mL/min   Anion gap 6 5 - 15  CBC   Collection Time: 03/16/18  3:24 PM  Result Value Ref Range   WBC 11.2 (H) 4.0 - 10.5 K/uL   RBC 4.21 3.87 - 5.11 MIL/uL   Hemoglobin 12.6 12.0 - 15.0 g/dL   HCT 81.137.9 91.436.0 - 78.246.0 %   MCV 90.0 78.0 - 100.0 fL   MCH 29.9 26.0 - 34.0 pg   MCHC 33.2 30.0 - 36.0 g/dL   RDW 95.614.0 21.311.5 - 08.615.5 %   Platelets 249 150 - 400 K/uL  I-Stat beta hCG blood, ED   Collection Time: 03/16/18   3:34 PM  Result Value Ref Range   I-stat hCG, quantitative >2,000.0 (H) <5 mIU/mL   Comment 3          Wet prep, genital   Collection Time: 03/16/18  8:45 PM  Result Value Ref Range   Yeast Wet Prep HPF POC NONE SEEN NONE SEEN   Trich, Wet Prep NONE SEEN NONE SEEN   Clue Cells Wet Prep HPF POC NONE SEEN NONE SEEN   WBC, Wet Prep HPF POC FEW (A) NONE SEEN   Sperm NONE SEEN   Urinalysis, Routine w reflex microscopic- may I&O cath if menses   Collection Time: 03/16/18  9:03 PM  Result Value Ref Range   Color,  Urine YELLOW YELLOW   APPearance CLEAR CLEAR   Specific Gravity, Urine 1.016 1.005 - 1.030   pH 7.0 5.0 - 8.0   Glucose, UA NEGATIVE NEGATIVE mg/dL   Hgb urine dipstick NEGATIVE NEGATIVE   Bilirubin Urine NEGATIVE NEGATIVE   Ketones, ur 5 (A) NEGATIVE mg/dL   Protein, ur NEGATIVE NEGATIVE mg/dL   Nitrite NEGATIVE NEGATIVE   Leukocytes, UA NEGATIVE NEGATIVE   RBC / HPF NONE SEEN 0 - 5 RBC/hpf   WBC, UA 0-5 0 - 5 WBC/hpf   Bacteria, UA RARE (A) NONE SEEN   Squamous Epithelial / LPF 0-5 (A) NONE SEEN  hCG, quantitative, pregnancy   Collection Time: 03/16/18  9:21 PM  Result Value Ref Range   hCG, Beta Chain, Quant, S 24,671 (H) <5 mIU/mL  Type and screen   Collection Time: 03/16/18  9:22 PM  Result Value Ref Range   ABO/RH(D) O POS    Antibody Screen NEG    Sample Expiration      03/19/2018 Performed at [facility], [address] [place], [place] [address]   ABO/Rh   Collection Time: 03/16/18  9:22 PM  Result Value Ref Range   ABO/RH(D) O POS    No rh immune globuloin      NOT A RH IMMUNE GLOBULIN CANDIDATE, PT RH POSITIVE Performed at [facility], [address] [place], [place] [address]   Type and screen [facility] [place]   Collection Time: 03/16/18 10:17 PM  Result Value Ref Range   ABO/RH(D) O POS    Antibody Screen NEG    Sample Expiration      03/19/2018 Performed at [facility], [address] [place], [place] [address]     EKG: No orders found for this or any previous visit.  Imaging Studies: US Ob Less Than 14 Weeks With Ob Transvaginal  Result Date: 03/16/2018 CLINICAL DATA:  Pelvic pain affecting pregnancy in the first trimester. EXAM: OBSTETRIC <14 WK [place] AND TRANSVAGINAL OB US TECHNIQUE: Both transabdominal and transvaginal ultrasound examinations were performed for complete evaluation of the gestation as well as the maternal uterus, adnexal regions, and pelvic cul-de-sac. Transvaginal technique was performed to assess early pregnancy. COMPARISON:  None. FINDINGS: Intrauterine gestational sac: None. Extra uterine gestational sac is identified just lateral to the right ovary. Yolk sac:  Visualized. Embryo:  Visualized. Cardiac Activity: Visualized. Heart Rate: 152 bpm CRL:  4.7 mm   6 w   1 d                  [place] EDC: 11/08/2018 Subchorionic hemorrhage:  None visualized. Maternal uterus/adnexae: Right ovary measured 5 x 2.9 x 3.4 cm. The left ovary was not visualized. Hypoechoic fluid compatible with blood products noted in the endometrium which measured 8.2 cm thickness. Small to moderate volume of complex fluid in the cul-de-sac with clot like material is also noted. IMPRESSION: Viable 6 week 1 day ectopic pregnancy in the right adnexa adjacent to the right ovary with hypoechoic complex fluid in the endometrial cavity compatible with blood products. Similarly, clot like material is seen in the free fluid within the cul-de-sac. These results were called by telephone at the time of interpretation on 03/16/2018 at 8:45 pm to Dr. Derwood Kaplan , who verbally acknowledged these results. Electronically Signed   By: Tollie Eth M.D.   On: 03/16/2018 20:45      Assessment: Right ectopic pregnancy, [redacted]w[redacted]d by CRL with +FCA Patient Active  Problem List   Diagnosis Date Noted  . PTSD (post-traumatic stress disorder) 11/02/2013  . Depression with suicidal ideation 10/31/2013  .  Polysubstance abuse (HCC) 10/31/2013  . Homeless 10/31/2013    Plan: Laparoscopic removal of right ectopic pregnancy, probable partial salpingectomy  Lazaro Arms 03/16/2018 11:48 PM

## 2018-03-16 NOTE — ED Notes (Signed)
Patient continues to complain about wait times to this Clinical research associatewriter. Patient tearful stating she "is in pain." Triage RN made aware.

## 2018-03-17 ENCOUNTER — Encounter (HOSPITAL_COMMUNITY): Payer: Self-pay | Admitting: *Deleted

## 2018-03-17 ENCOUNTER — Other Ambulatory Visit: Payer: Self-pay

## 2018-03-17 DIAGNOSIS — O00109 Unspecified tubal pregnancy without intrauterine pregnancy: Secondary | ICD-10-CM | POA: Diagnosis present

## 2018-03-17 DIAGNOSIS — O00101 Right tubal pregnancy without intrauterine pregnancy: Secondary | ICD-10-CM

## 2018-03-17 LAB — GC/CHLAMYDIA PROBE AMP (~~LOC~~) NOT AT ARMC
Chlamydia: NEGATIVE
Neisseria Gonorrhea: NEGATIVE

## 2018-03-17 LAB — ABO/RH: ABO/RH(D): O POS

## 2018-03-17 MED ORDER — PROPOFOL 10 MG/ML IV BOLUS
INTRAVENOUS | Status: DC | PRN
  Administered 2018-03-16: 200 mg via INTRAVENOUS

## 2018-03-17 MED ORDER — IBUPROFEN 800 MG PO TABS: 800.0000 mg | ORAL_TABLET | Freq: Three times a day (TID) | ORAL | 0 refills | Status: DC | PRN

## 2018-03-17 MED ORDER — BUPIVACAINE HCL (PF) 0.5 % IJ SOLN
INTRAMUSCULAR | Status: DC | PRN
  Administered 2018-03-17: 30 mL

## 2018-03-17 MED ORDER — ONDANSETRON HCL 40 MG/20ML IJ SOLN
8.0000 mg | Freq: Four times a day (QID) | INTRAMUSCULAR | Status: DC | PRN
  Filled 2018-03-17: qty 4

## 2018-03-17 MED ORDER — SUCCINYLCHOLINE CHLORIDE 20 MG/ML IJ SOLN
INTRAMUSCULAR | Status: DC | PRN
  Administered 2018-03-16: 120 mg via INTRAVENOUS

## 2018-03-17 MED ORDER — SCOPOLAMINE 1 MG/3DAYS TD PT72
MEDICATED_PATCH | TRANSDERMAL | Status: AC
  Filled 2018-03-17: qty 1

## 2018-03-17 MED ORDER — MEPERIDINE HCL 25 MG/ML IJ SOLN
6.2500 mg | INTRAMUSCULAR | Status: DC | PRN
  Administered 2018-03-17 (×2): 12.5 mg via INTRAVENOUS

## 2018-03-17 MED ORDER — OXYCODONE-ACETAMINOPHEN 5-325 MG PO TABS: 1.0000 | ORAL_TABLET | ORAL | Status: DC | PRN

## 2018-03-17 MED ORDER — SUGAMMADEX SODIUM 200 MG/2ML IV SOLN
INTRAVENOUS | Status: DC | PRN
  Administered 2018-03-17: 175 mg via INTRAVENOUS

## 2018-03-17 MED ORDER — SUGAMMADEX SODIUM 200 MG/2ML IV SOLN
INTRAVENOUS | Status: AC
  Filled 2018-03-17: qty 2

## 2018-03-17 MED ORDER — METOCLOPRAMIDE HCL 5 MG/ML IJ SOLN: 10.0000 mg | Freq: Once | INTRAMUSCULAR | Status: DC | PRN

## 2018-03-17 MED ORDER — MIDAZOLAM HCL 5 MG/5ML IJ SOLN
INTRAMUSCULAR | Status: DC | PRN
  Administered 2018-03-16: 2 mg via INTRAVENOUS

## 2018-03-17 MED ORDER — MEPERIDINE HCL 25 MG/ML IJ SOLN
INTRAMUSCULAR | Status: AC
  Filled 2018-03-17: qty 1

## 2018-03-17 MED ORDER — KETOROLAC TROMETHAMINE 30 MG/ML IJ SOLN
INTRAMUSCULAR | Status: DC | PRN
  Administered 2018-03-17: 30 mg via INTRAVENOUS

## 2018-03-17 MED ORDER — DEXAMETHASONE SODIUM PHOSPHATE 10 MG/ML IJ SOLN
INTRAMUSCULAR | Status: DC | PRN
  Administered 2018-03-17: 10 mg via INTRAVENOUS

## 2018-03-17 MED ORDER — SCOPOLAMINE 1 MG/3DAYS TD PT72
MEDICATED_PATCH | TRANSDERMAL | Status: DC | PRN
  Administered 2018-03-17: 1 via TRANSDERMAL

## 2018-03-17 MED ORDER — HYDROMORPHONE HCL 1 MG/ML IJ SOLN: 1.0000 mg | INTRAMUSCULAR | Status: DC | PRN

## 2018-03-17 MED ORDER — BUPIVACAINE HCL (PF) 0.5 % IJ SOLN
INTRAMUSCULAR | Status: AC
  Filled 2018-03-17: qty 30

## 2018-03-17 MED ORDER — KETOROLAC TROMETHAMINE 30 MG/ML IJ SOLN
INTRAMUSCULAR | Status: AC
  Filled 2018-03-17: qty 1

## 2018-03-17 MED ORDER — KCL IN DEXTROSE-NACL 20-5-0.45 MEQ/L-%-% IV SOLN
INTRAVENOUS | Status: DC
  Filled 2018-03-17: qty 1000

## 2018-03-17 MED ORDER — ACETAMINOPHEN-CODEINE #3 300-30 MG PO TABS
1.0000 | ORAL_TABLET | Freq: Four times a day (QID) | ORAL | Status: DC | PRN
  Administered 2018-03-17: 1 via ORAL
  Filled 2018-03-17: qty 1

## 2018-03-17 MED ORDER — HYDROMORPHONE HCL 1 MG/ML IJ SOLN
INTRAMUSCULAR | Status: AC
  Filled 2018-03-17: qty 1

## 2018-03-17 MED ORDER — HYDROCODONE-ACETAMINOPHEN 5-325 MG PO TABS: 1.0000 | ORAL_TABLET | Freq: Four times a day (QID) | ORAL | 0 refills | Status: DC | PRN

## 2018-03-17 MED ORDER — HYDROMORPHONE HCL 1 MG/ML IJ SOLN
0.2500 mg | INTRAMUSCULAR | Status: DC | PRN
  Administered 2018-03-17 (×2): 0.5 mg via INTRAVENOUS

## 2018-03-17 MED ORDER — ROCURONIUM BROMIDE 100 MG/10ML IV SOLN
INTRAVENOUS | Status: DC | PRN
  Administered 2018-03-17: 30 mg via INTRAVENOUS

## 2018-03-17 MED ORDER — LIDOCAINE HCL (CARDIAC) 20 MG/ML IV SOLN
INTRAVENOUS | Status: DC | PRN
  Administered 2018-03-16: 100 mg via INTRAVENOUS

## 2018-03-17 MED ORDER — ONDANSETRON HCL 4 MG/2ML IJ SOLN
INTRAMUSCULAR | Status: DC | PRN
  Administered 2018-03-17: 4 mg via INTRAVENOUS

## 2018-03-17 MED ORDER — SUCCINYLCHOLINE CHLORIDE 200 MG/10ML IV SOSY
PREFILLED_SYRINGE | INTRAVENOUS | Status: AC
  Filled 2018-03-17: qty 10

## 2018-03-17 MED ORDER — FENTANYL CITRATE (PF) 100 MCG/2ML IJ SOLN
INTRAMUSCULAR | Status: DC | PRN
  Administered 2018-03-16 – 2018-03-17 (×2): 100 ug via INTRAVENOUS
  Administered 2018-03-17: 50 ug via INTRAVENOUS

## 2018-03-17 MED ORDER — ONDANSETRON HCL 4 MG PO TABS: 8.0000 mg | ORAL_TABLET | Freq: Four times a day (QID) | ORAL | Status: DC | PRN

## 2018-03-17 MED ORDER — HYDROCODONE-ACETAMINOPHEN 7.5-325 MG PO TABS: 1.0000 | ORAL_TABLET | Freq: Once | ORAL | Status: DC | PRN

## 2018-03-17 NOTE — Progress Notes (Signed)
Discharge teaching complete. Pt understood all information and did not have any questions. 

## 2018-03-17 NOTE — Transfer of Care (Signed)
Immediate Anesthesia Transfer of Care Note  Patient: Jane Finley  Procedure(s) Performed: LAPAROSCOPY OPERATIVE (N/A Abdomen) LAPAROSCOPIC RIGHT SALPINGECTOMY FOR MANAGEMENT OF ECTOPIC PREGNANCY (Right Abdomen)  Patient Location: PACU  Anesthesia Type:General  Level of Consciousness: awake, alert  and oriented  Airway & Oxygen Therapy: Patient Spontanous Breathing and Patient connected to nasal cannula oxygen  Post-op Assessment: Report given to RN and Post -op Vital signs reviewed and stable  Post vital signs: Reviewed and stable  Last Vitals:  Vitals:   03/16/18 1939 03/16/18 2153  BP: 124/70 139/76  Pulse: 78 80  Resp: 18 18  Temp:  37 C  SpO2: 100%     Last Pain:  Vitals:   03/16/18 2156  TempSrc:   PainSc: 7          Complications: No apparent anesthesia complications

## 2018-03-17 NOTE — Anesthesia Postprocedure Evaluation (Signed)
Anesthesia Post Note  Patient: Jane Finley  Procedure(s) Performed: LAPAROSCOPY OPERATIVE (N/A Abdomen) LAPAROSCOPIC RIGHT SALPINGECTOMY FOR MANAGEMENT OF ECTOPIC PREGNANCY (Right Abdomen)     Patient location during evaluation: PACU Anesthesia Type: General Level of consciousness: awake and alert and oriented Pain management: pain level controlled Vital Signs Assessment: post-procedure vital signs reviewed and stable Respiratory status: spontaneous breathing, nonlabored ventilation and respiratory function stable Cardiovascular status: blood pressure returned to baseline and stable Postop Assessment: no apparent nausea or vomiting Anesthetic complications: no    Last Vitals:  Vitals:   03/17/18 0115 03/17/18 0130  BP: (!) 103/54 (!) 103/58  Pulse: 76 72  Resp: (!) 22 19  Temp:    SpO2: 100% 99%    Last Pain:  Vitals:   03/17/18 0130  TempSrc:   PainSc: 4    Pain Goal:                 [name]

## 2018-03-17 NOTE — Addendum Note (Signed)
Addendum  created 03/17/18 0809 by Cleda ClarksBrowder, Latitia Housewright R, CRNA   Sign clinical note

## 2018-03-17 NOTE — Op Note (Signed)
Preoperative diagnosis: Right ectopic pregnancy, viable, 6 weeks 1 days crown-rump length,                                             +FCA  Postoperative diagnosis: Right ectopic pregnancy involving the entire Fallopian tube, left hydrosalpinx  Procedure: Laparoscopic removal of right ectopic pregnancy with essentially total salpingectomy   Surgeon: Lazaro ArmsEURE,Etosha Wetherell H   Anesthesia: Gen. Endotracheal   Findings: [facility] tonight complaining of right lower quadrant crampy pain which began a few hours prior to presentation.  Sonogram revealed a viable right ectopic pregnancy measuring 6 weeks and 1 days by crown-rump length +FCA and suspected to have undergone minimal rupture. I had the patient transferred [facility] over to women[facility]U.  On evaluation here the patient was in moderate pain and hemodynamically stable.   Intraoperatively the patient had about 100 cc of mostly clotted blood there was a small trickle coming from the ectopic but nothing significant. The right tube was not apporpriate for linear salpingostomy.  The left Fallopian tube was a large hydrosalpinx with blunted fimbria consistent with prior PID with post infectious changes  Description of operation: Patient was taken to the operating room and placed in the supine position where she underwent general endotracheal anesthesia. She was placed in dorsal lithotomy position. She was prepped and draped in usual sterile fashion. Foley catheter was placed. Incision was made in the umbilicus and a varies needle was placed peritoneal cavity with one pass that difficulty. The peritoneal cavity was insufflated. A 1011 non-bladed trocar was placed using a video laparoscope under direct visualization without difficulty. An incision was made in the right and left lower quadrant and 5 mm non-bladed trochars were placed in each site without difficulty under direct visualization. There was obvious right ectopic pregnancy  with about 100 cc of blood in the belly . Harmonic scalpel was used and a salpingectomy was performed. There was good hemostasis. The pelvis was irrigated and hemostasis once again confirmed. Again the left Fallopian tube is a hydrosalpinx The trochars were removed and the gas was allowed to escape from the abdomen. The 2-5 mm trocar sites were closed with staples and injected with a total of 10 cc of 0.5% marcaine. The umbilical fascia was closed with single 2-0 Vicryl suture and the subcutaneous tissue was also closed using Vicryl. The skin was closed using skin staples. 10 cc of 0.5% marcainel was injected here as well.   The patient remained hemodynamically stable throughout the entire procedure was awakened from anesthesia and taken to the recovery room in good stable condition with all counts being correct.   She received 30 mg of Toradol prophylactically. There was no real intraoperative blood loss only the hemoperitoneum which was appreciated and present upon peritoneal entry.   Lazaro ArmsLuther H Elianna Windom, MD 03/17/2018 1:21 AM

## 2018-03-17 NOTE — Discharge Instructions (Signed)
Ectopic Pregnancy °An ectopic pregnancy happens when a fertilized egg grows outside the uterus. A pregnancy cannot live outside of the uterus. This problem often happens in the fallopian tube. It is often caused by damage to the fallopian tube. °If this problem is found early, you may be treated with medicine. If your tube tears or bursts open (ruptures), you will bleed inside. This is an emergency. You will need surgery. Get help right away. °What are the signs or symptoms? °You may have normal pregnancy symptoms at first. These include: °· Missing your period. °· Feeling sick to your stomach (nauseous). °· Being tired. °· Having tender breasts. ° °Then, you may start to have symptoms that are not normal. These include: °· Pain with sex (intercourse). °· Bleeding from the vagina. This includes light bleeding (spotting). °· Belly (abdomen) or lower belly cramping or pain. This may be felt on one side. °· A fast heartbeat (pulse). °· Passing out (fainting) after going poop (bowel movement). ° °If your tube tears, you may have symptoms such as: °· Really bad pain in the belly or lower belly. This happens suddenly. °· Dizziness. °· Passing out. °· Shoulder pain. ° °Get help right away if: °You have any of these symptoms. This is an emergency. °This information is not intended to replace advice given to you by your health care provider. Make sure you discuss any questions you have with your health care provider. °Document Released: 03/15/2009 Document Revised: 05/24/2016 Document Reviewed: 07/29/2013 °Elsevier Interactive Patient Education © 2017 [employer] Inc. ° °

## 2018-03-17 NOTE — Discharge Summary (Signed)
Physician Discharge Summary  Patient ID: Jane Finley MRN: [id number] DOB/AGE: 26/07/1990 26 y.o.  Admit date: 03/16/2018 Discharge date: 03/17/2018  Admission Diagnoses: Right ectopic pregnancy Discharge Diagnoses:  Active Problems:   Ectopic pregnancy, tubal   Discharged Condition: good  Hospital Course: unremarkable see op note  Consults:   Significant Diagnostic Studies:   Treatments: surgery: laparoscopic salpingectomy  Discharge Exam: Blood pressure (!) 105/41, pulse 60, temperature 98.6 F (37 C), temperature source Oral, resp. rate 18, height 5\' 2"  (1.575 m), weight 187 lb (84.8 kg), last menstrual period 01/24/2018, SpO2 99 %. General appearance: alert, cooperative and no distress GI: soft, non-tender; bowel sounds normal; no masses,  no organomegaly Incision/Wound:clean dry  Disposition: Discharge disposition: 01-Home or Self Care       Discharge Instructions     Remove dressing in 72 hours   Complete by:  As directed    Call MD for:  persistant nausea and vomiting   Complete by:  As directed    Call MD for:  severe uncontrolled pain   Complete by:  As directed    Call MD for:  temperature >100.4   Complete by:  As directed    Diet - low sodium heart healthy   Complete by:  As directed    Driving Restrictions   Complete by:  As directed    No driving today   Increase activity slowly   Complete by:  As directed    Lifting restrictions   Complete by:  As directed    Do not lift more than 10 pounds for 1 week   Sexual Activity Restrictions   Complete by:  As directed    No restricitions     Allergies as of 03/17/2018   No Known Allergies     Medication List    TAKE these medications   benzonatate 100 MG capsule Commonly known as:  TESSALON Take 1 capsule (100 mg total) by mouth 3 (three) times daily as needed for cough.   HYDROcodone-acetaminophen 5-325 MG tablet Commonly known as:  NORCO/VICODIN Take 1 tablet by mouth every 6 (six)  hours as needed.   ibuprofen 800 MG tablet Commonly known as:  ADVIL,MOTRIN Take 1 tablet (800 mg total) by mouth every 8 (eight) hours as needed. What changed:  reasons to take this      Follow-up Information    [facility] week([facility]).   Why:  post op check Contact information: [address] [place] [phone]          Signed: Lazaro ArmsLuther H Eure 03/17/2018, 8:05 AM

## 2018-03-17 NOTE — Anesthesia Procedure Notes (Signed)
Procedure Name: Intubation Date/Time: 03/16/2018 11:57 PM Performed by: Junious SilkGilbert, Lahari Suttles, CRNA Pre-anesthesia Checklist: Patient identified, Emergency Drugs available, Suction available, Patient being monitored and Timeout performed Patient Re-evaluated:Patient Re-evaluated prior to induction Oxygen Delivery Method: Circle system utilized Preoxygenation: Pre-oxygenation with 100% oxygen Induction Type: IV induction, Rapid sequence and Cricoid Pressure applied Laryngoscope Size: Miller and 2 Grade View: Grade I Tube type: Oral Tube size: 7.0 mm Number of attempts: 1 Airway Equipment and Method: Stylet Placement Confirmation: ETT inserted through vocal cords under direct vision,  positive ETCO2,  CO2 detector and breath sounds checked- equal and bilateral Secured at: 22 cm Tube secured with: Tape Dental Injury: Teeth and Oropharynx as per pre-operative assessment

## 2018-03-17 NOTE — Anesthesia Postprocedure Evaluation (Signed)
Anesthesia Post Note  Patient: Jane Finley  Procedure(s) Performed: LAPAROSCOPY OPERATIVE (N/A Abdomen) LAPAROSCOPIC RIGHT SALPINGECTOMY FOR MANAGEMENT OF ECTOPIC PREGNANCY (Right Abdomen)     Patient location during evaluation: Women's Unit Anesthesia Type: General Level of consciousness: awake and alert and patient cooperative Pain management: pain level controlled Vital Signs Assessment: post-procedure vital signs reviewed and stable Respiratory status: spontaneous breathing Cardiovascular status: blood pressure returned to baseline and stable Postop Assessment: no apparent nausea or vomiting and adequate PO intake Anesthetic complications: no    Last Vitals:  Vitals:   03/17/18 0605 03/17/18 0746  BP: (!) 111/58 (!) 105/41  Pulse: (!) 58 60  Resp: 18 18  Temp: 36.6 C 37 C  SpO2: 99% 99%    Last Pain:  Vitals:   03/17/18 0746  TempSrc: Oral  PainSc:    Pain Goal: Patients Stated Pain Goal: 4 (03/17/18 0554)               Cleda ClarksBrowder, Nitin Mckowen R

## 2018-03-25 ENCOUNTER — Inpatient Hospital Stay (HOSPITAL_COMMUNITY): Payer: Medicaid Other | Admitting: Anesthesiology

## 2018-03-25 ENCOUNTER — Inpatient Hospital Stay (HOSPITAL_COMMUNITY): Payer: Medicaid Other

## 2018-03-25 ENCOUNTER — Other Ambulatory Visit: Payer: Self-pay

## 2018-03-25 ENCOUNTER — Encounter (HOSPITAL_COMMUNITY)
Admission: AD | Disposition: A | Discharge status: Home / Self Care | Payer: Self-pay | Source: Ambulatory Visit | Attending: Obstetrics & Gynecology

## 2018-03-25 ENCOUNTER — Observation Stay (HOSPITAL_COMMUNITY)
Admission: AD | Admit: 2018-03-25 | Discharge: 2018-03-26 | Disposition: A | Discharge status: Home / Self Care | Payer: Medicaid Other | Source: Ambulatory Visit | Attending: Obstetrics & Gynecology | Admitting: Obstetrics & Gynecology

## 2018-03-25 DIAGNOSIS — R102 Pelvic and perineal pain unspecified side: Secondary | ICD-10-CM

## 2018-03-25 DIAGNOSIS — F319 Bipolar disorder, unspecified: Secondary | ICD-10-CM | POA: Insufficient documentation

## 2018-03-25 DIAGNOSIS — Z9889 Other specified postprocedural states: Secondary | ICD-10-CM

## 2018-03-25 DIAGNOSIS — O99341 Other mental disorders complicating pregnancy, first trimester: Secondary | ICD-10-CM | POA: Diagnosis not present

## 2018-03-25 DIAGNOSIS — F419 Anxiety disorder, unspecified: Secondary | ICD-10-CM | POA: Insufficient documentation

## 2018-03-25 DIAGNOSIS — O99331 Smoking (tobacco) complicating pregnancy, first trimester: Secondary | ICD-10-CM | POA: Diagnosis not present

## 2018-03-25 DIAGNOSIS — F17210 Nicotine dependence, cigarettes, uncomplicated: Secondary | ICD-10-CM | POA: Diagnosis not present

## 2018-03-25 DIAGNOSIS — O99211 Obesity complicating pregnancy, first trimester: Secondary | ICD-10-CM | POA: Diagnosis not present

## 2018-03-25 DIAGNOSIS — O00109 Unspecified tubal pregnancy without intrauterine pregnancy: Secondary | ICD-10-CM | POA: Diagnosis present

## 2018-03-25 DIAGNOSIS — O00102 Left tubal pregnancy without intrauterine pregnancy: Secondary | ICD-10-CM | POA: Diagnosis not present

## 2018-03-25 DIAGNOSIS — Z3A08 8 weeks gestation of pregnancy: Secondary | ICD-10-CM | POA: Diagnosis not present

## 2018-03-25 DIAGNOSIS — G8918 Other acute postprocedural pain: Secondary | ICD-10-CM | POA: Diagnosis not present

## 2018-03-25 DIAGNOSIS — Z9079 Acquired absence of other genital organ(s): Secondary | ICD-10-CM

## 2018-03-25 HISTORY — PX: UNILATERAL SALPINGECTOMY: SHX6160

## 2018-03-25 HISTORY — PX: LAPAROSCOPY: SHX197

## 2018-03-25 LAB — URINALYSIS, ROUTINE W REFLEX MICROSCOPIC
Bilirubin Urine: NEGATIVE
Glucose, UA: NEGATIVE mg/dL
Ketones, ur: 5 mg/dL — AB
Leukocytes, UA: NEGATIVE
Nitrite: NEGATIVE
PH: 6 (ref 5.0–8.0)
Protein, ur: NEGATIVE mg/dL
SPECIFIC GRAVITY, URINE: 1.023 (ref 1.005–1.030)

## 2018-03-25 LAB — CBC
HCT: 37 % (ref 36.0–46.0)
HEMOGLOBIN: 12.9 g/dL (ref 12.0–15.0)
MCH: 30.9 pg (ref 26.0–34.0)
MCHC: 34.9 g/dL (ref 30.0–36.0)
MCV: 88.7 fL (ref 78.0–100.0)
PLATELETS: 302 10*3/uL (ref 150–400)
RBC: 4.17 MIL/uL (ref 3.87–5.11)
RDW: 14.1 % (ref 11.5–15.5)
WBC: 9.8 10*3/uL (ref 4.0–10.5)

## 2018-03-25 LAB — TYPE AND SCREEN
ABO/RH(D): O POS
Antibody Screen: NEGATIVE

## 2018-03-25 LAB — HCG, QUANTITATIVE, PREGNANCY: HCG, BETA CHAIN, QUANT, S: 32669 m[IU]/mL — AB (ref <5)

## 2018-03-25 SURGERY — LAPAROSCOPY, DIAGNOSTIC
Anesthesia: General | Site: Abdomen

## 2018-03-25 MED ORDER — SODIUM CHLORIDE 0.9 % IR SOLN
Status: DC | PRN
  Administered 2018-03-25: 3000 mL

## 2018-03-25 MED ORDER — LIDOCAINE HCL (CARDIAC) 20 MG/ML IV SOLN
INTRAVENOUS | Status: AC
  Filled 2018-03-25: qty 5

## 2018-03-25 MED ORDER — FAMOTIDINE IN NACL 20-0.9 MG/50ML-% IV SOLN
20.0000 mg | Freq: Once | INTRAVENOUS | Status: AC
  Administered 2018-03-25: 20 mg via INTRAVENOUS
  Filled 2018-03-25: qty 50

## 2018-03-25 MED ORDER — METOCLOPRAMIDE HCL 5 MG/ML IJ SOLN
INTRAMUSCULAR | Status: AC
  Filled 2018-03-25: qty 2

## 2018-03-25 MED ORDER — MIDAZOLAM HCL 2 MG/2ML IJ SOLN
INTRAMUSCULAR | Status: AC
  Filled 2018-03-25: qty 2

## 2018-03-25 MED ORDER — PROMETHAZINE HCL 25 MG/ML IJ SOLN: 6.2500 mg | INTRAMUSCULAR | Status: DC | PRN

## 2018-03-25 MED ORDER — SOD CITRATE-CITRIC ACID 500-334 MG/5ML PO SOLN
30.0000 mL | Freq: Once | ORAL | Status: AC
  Administered 2018-03-25: 30 mL via ORAL
  Filled 2018-03-25: qty 15

## 2018-03-25 MED ORDER — ROCURONIUM BROMIDE 100 MG/10ML IV SOLN
INTRAVENOUS | Status: AC
  Filled 2018-03-25: qty 1

## 2018-03-25 MED ORDER — KETOROLAC TROMETHAMINE 30 MG/ML IJ SOLN
INTRAMUSCULAR | Status: DC | PRN
  Administered 2018-03-25: 30 mg via INTRAVENOUS

## 2018-03-25 MED ORDER — ONDANSETRON HCL 4 MG/2ML IJ SOLN: 4.0000 mg | Freq: Four times a day (QID) | INTRAMUSCULAR | Status: DC | PRN

## 2018-03-25 MED ORDER — PHENYLEPHRINE 40 MCG/ML (10ML) SYRINGE FOR IV PUSH (FOR BLOOD PRESSURE SUPPORT)
PREFILLED_SYRINGE | INTRAVENOUS | Status: AC
  Filled 2018-03-25: qty 10

## 2018-03-25 MED ORDER — SUCCINYLCHOLINE CHLORIDE 20 MG/ML IJ SOLN
INTRAMUSCULAR | Status: DC | PRN
  Administered 2018-03-25: 100 mg via INTRAVENOUS

## 2018-03-25 MED ORDER — KETOROLAC TROMETHAMINE 30 MG/ML IJ SOLN: 30.0000 mg | Freq: Four times a day (QID) | INTRAMUSCULAR | Status: DC

## 2018-03-25 MED ORDER — ROCURONIUM BROMIDE 100 MG/10ML IV SOLN
INTRAVENOUS | Status: DC | PRN
  Administered 2018-03-25: 20 mg via INTRAVENOUS

## 2018-03-25 MED ORDER — DEXAMETHASONE SODIUM PHOSPHATE 10 MG/ML IJ SOLN
INTRAMUSCULAR | Status: DC | PRN
  Administered 2018-03-25: 4 mg via INTRAVENOUS

## 2018-03-25 MED ORDER — LACTATED RINGERS IV SOLN
INTRAVENOUS | Status: DC | PRN
  Administered 2018-03-25 (×4): via INTRAVENOUS

## 2018-03-25 MED ORDER — OXYCODONE-ACETAMINOPHEN 5-325 MG PO TABS
1.0000 | ORAL_TABLET | ORAL | Status: DC | PRN
  Administered 2018-03-25 – 2018-03-26 (×4): 2 via ORAL
  Filled 2018-03-25 (×4): qty 2

## 2018-03-25 MED ORDER — PHENYLEPHRINE HCL 10 MG/ML IJ SOLN
INTRAMUSCULAR | Status: DC | PRN
  Administered 2018-03-25: 80 ug via INTRAVENOUS
  Administered 2018-03-25: 40 ug via INTRAVENOUS

## 2018-03-25 MED ORDER — ONDANSETRON HCL 4 MG/2ML IJ SOLN
INTRAMUSCULAR | Status: AC
  Filled 2018-03-25: qty 2

## 2018-03-25 MED ORDER — PROPOFOL 10 MG/ML IV BOLUS
INTRAVENOUS | Status: AC
  Filled 2018-03-25: qty 40

## 2018-03-25 MED ORDER — MIDAZOLAM HCL 2 MG/2ML IJ SOLN
INTRAMUSCULAR | Status: DC | PRN
  Administered 2018-03-25: 2 mg via INTRAVENOUS

## 2018-03-25 MED ORDER — SUCCINYLCHOLINE CHLORIDE 200 MG/10ML IV SOSY
PREFILLED_SYRINGE | INTRAVENOUS | Status: AC
  Filled 2018-03-25: qty 10

## 2018-03-25 MED ORDER — HYDROMORPHONE HCL 1 MG/ML IJ SOLN
0.2500 mg | INTRAMUSCULAR | Status: DC | PRN
  Administered 2018-03-25 (×2): 0.5 mg via INTRAVENOUS

## 2018-03-25 MED ORDER — LACTATED RINGERS IV SOLN: INTRAVENOUS | Status: DC

## 2018-03-25 MED ORDER — LACTATED RINGERS IV BOLUS
1000.0000 mL | Freq: Once | INTRAVENOUS | Status: AC
  Administered 2018-03-25: 1000 mL via INTRAVENOUS

## 2018-03-25 MED ORDER — OXYCODONE HCL 5 MG PO TABS: 5.0000 mg | ORAL_TABLET | Freq: Once | ORAL | Status: DC | PRN

## 2018-03-25 MED ORDER — HYDROMORPHONE HCL 1 MG/ML IJ SOLN
INTRAMUSCULAR | Status: AC
  Filled 2018-03-25: qty 1

## 2018-03-25 MED ORDER — BUPIVACAINE HCL (PF) 0.25 % IJ SOLN
INTRAMUSCULAR | Status: DC | PRN
  Administered 2018-03-25: 7 mL

## 2018-03-25 MED ORDER — KETOROLAC TROMETHAMINE 30 MG/ML IJ SOLN
30.0000 mg | Freq: Four times a day (QID) | INTRAMUSCULAR | Status: DC
  Administered 2018-03-25: 30 mg via INTRAVENOUS
  Filled 2018-03-25: qty 1

## 2018-03-25 MED ORDER — OXYCODONE HCL 5 MG/5ML PO SOLN: 5.0000 mg | Freq: Once | ORAL | Status: DC | PRN

## 2018-03-25 MED ORDER — LIDOCAINE HCL (CARDIAC) 20 MG/ML IV SOLN
INTRAVENOUS | Status: DC | PRN
  Administered 2018-03-25: 60 mg via INTRAVENOUS

## 2018-03-25 MED ORDER — KETOROLAC TROMETHAMINE 30 MG/ML IJ SOLN
INTRAMUSCULAR | Status: AC
  Filled 2018-03-25: qty 1

## 2018-03-25 MED ORDER — HYDROCODONE-ACETAMINOPHEN 5-325 MG PO TABS: 1.0000 | ORAL_TABLET | Freq: Four times a day (QID) | ORAL | 0 refills | Status: DC | PRN

## 2018-03-25 MED ORDER — FENTANYL CITRATE (PF) 250 MCG/5ML IJ SOLN
INTRAMUSCULAR | Status: AC
  Filled 2018-03-25: qty 5

## 2018-03-25 MED ORDER — OXYCODONE-ACETAMINOPHEN 5-325 MG PO TABS
2.0000 | ORAL_TABLET | Freq: Once | ORAL | Status: AC
  Administered 2018-03-25: 2 via ORAL
  Filled 2018-03-25: qty 2

## 2018-03-25 MED ORDER — HYDROMORPHONE HCL 1 MG/ML IJ SOLN
INTRAMUSCULAR | Status: DC | PRN
  Administered 2018-03-25: 1 mg via INTRAVENOUS

## 2018-03-25 MED ORDER — FENTANYL CITRATE (PF) 100 MCG/2ML IJ SOLN
INTRAMUSCULAR | Status: DC | PRN
  Administered 2018-03-25: 100 ug via INTRAVENOUS
  Administered 2018-03-25 (×3): 50 ug via INTRAVENOUS

## 2018-03-25 MED ORDER — ONDANSETRON HCL 4 MG/2ML IJ SOLN
INTRAMUSCULAR | Status: DC | PRN
  Administered 2018-03-25: 4 mg via INTRAVENOUS

## 2018-03-25 MED ORDER — DEXAMETHASONE SODIUM PHOSPHATE 4 MG/ML IJ SOLN
INTRAMUSCULAR | Status: AC
  Filled 2018-03-25: qty 1

## 2018-03-25 MED ORDER — SUGAMMADEX SODIUM 200 MG/2ML IV SOLN
INTRAVENOUS | Status: AC
  Filled 2018-03-25: qty 2

## 2018-03-25 MED ORDER — METOCLOPRAMIDE HCL 5 MG/ML IJ SOLN
INTRAMUSCULAR | Status: DC | PRN
  Administered 2018-03-25: 10 mg via INTRAVENOUS

## 2018-03-25 MED ORDER — PROPOFOL 10 MG/ML IV BOLUS
INTRAVENOUS | Status: DC | PRN
  Administered 2018-03-25: 170 mg via INTRAVENOUS

## 2018-03-25 MED ORDER — MEPERIDINE HCL 25 MG/ML IJ SOLN: 6.2500 mg | INTRAMUSCULAR | Status: DC | PRN

## 2018-03-25 MED ORDER — SUGAMMADEX SODIUM 200 MG/2ML IV SOLN
INTRAVENOUS | Status: DC | PRN
  Administered 2018-03-25: 170 mg via INTRAVENOUS

## 2018-03-25 MED ORDER — ONDANSETRON HCL 4 MG PO TABS: 4.0000 mg | ORAL_TABLET | Freq: Four times a day (QID) | ORAL | Status: DC | PRN

## 2018-03-25 SURGICAL SUPPLY — 33 items
ADH SKN CLS APL DERMABOND .7 (GAUZE/BANDAGES/DRESSINGS) ×4
BAG SPEC RTRVL LRG 6X4 10 (ENDOMECHANICALS) ×2
CABLE HIGH FREQUENCY MONO STRZ (ELECTRODE) IMPLANT
CLOSURE WOUND 1/2 X4 (GAUZE/BANDAGES/DRESSINGS)
DERMABOND ADVANCED (GAUZE/BANDAGES/DRESSINGS) ×4
DERMABOND ADVANCED .7 DNX12 (GAUZE/BANDAGES/DRESSINGS) IMPLANT
DRSG OPSITE POSTOP 3X4 (GAUZE/BANDAGES/DRESSINGS) ×2 IMPLANT
DURAPREP 26ML APPLICATOR (WOUND CARE) ×4 IMPLANT
GLOVE BIO SURGEON STRL SZ 6.5 (GLOVE) ×3 IMPLANT
GLOVE BIO SURGEONS STRL SZ 6.5 (GLOVE) ×1
GLOVE BIOGEL PI IND STRL 7.0 (GLOVE) ×8 IMPLANT
GLOVE BIOGEL PI INDICATOR 7.0 (GLOVE) ×8
GOWN STRL REUS W/TWL LRG LVL3 (GOWN DISPOSABLE) ×8 IMPLANT
NEEDLE INSUFFLATION 120MM (ENDOMECHANICALS) ×4 IMPLANT
NS IRRIG 1000ML POUR BTL (IV SOLUTION) ×4 IMPLANT
PACK LAPAROSCOPY BASIN (CUSTOM PROCEDURE TRAY) ×4 IMPLANT
PACK TRENDGUARD 450 HYBRID PRO (MISCELLANEOUS) IMPLANT
PACK TRENDGUARD 600 HYBRD PROC (MISCELLANEOUS) IMPLANT
POUCH SPECIMEN RETRIEVAL 10MM (ENDOMECHANICALS) ×2 IMPLANT
PROTECTOR NERVE ULNAR (MISCELLANEOUS) ×8 IMPLANT
SET IRRIG TUBING LAPAROSCOPIC (IRRIGATION / IRRIGATOR) ×2 IMPLANT
SHEARS HARMONIC ACE PLUS 36CM (ENDOMECHANICALS) ×2 IMPLANT
SLEEVE XCEL OPT CAN 5 100 (ENDOMECHANICALS) ×2 IMPLANT
STRIP CLOSURE SKIN 1/2X4 (GAUZE/BANDAGES/DRESSINGS) IMPLANT
SUT VICRYL 0 UR6 27IN ABS (SUTURE) ×4 IMPLANT
SUT VICRYL 4-0 PS2 18IN ABS (SUTURE) ×6 IMPLANT
TOWEL OR 17X24 6PK STRL BLUE (TOWEL DISPOSABLE) ×8 IMPLANT
TRAY FOLEY CATH SILVER 16FR (SET/KITS/TRAYS/PACK) ×4 IMPLANT
TRENDGUARD 450 HYBRID PRO PACK (MISCELLANEOUS) ×4
TRENDGUARD 600 HYBRID PROC PK (MISCELLANEOUS)
TROCAR XCEL DIL TIP R 11M (ENDOMECHANICALS) ×4 IMPLANT
TROCAR XCEL NON-BLD 5MMX100MML (ENDOMECHANICALS) ×4 IMPLANT
WARMER LAPAROSCOPE (MISCELLANEOUS) ×4 IMPLANT

## 2018-03-25 NOTE — H&P (Signed)
Adam Phenix, MD  Physician  Obstetrics  MAU Provider Note  Addendum  Date of Service:  03/25/2018 6:00 AM          Expand All Collapse All       [] Hide copied text  [] Hover for details    History   CSN: [id number]  Arrival date and time: 03/25/18 0453   First Provider Initiated Contact with Patient 03/25/18 0547      No chief complaint on file.  HPI  Ms.  Rosy Estabrook is a 28 y.o. year old G23P1011 female at [redacted]w[redacted]d weeks gestation who presents to MAU via EMS reporting she had lap RT salpingectomy to remove ectopic pregnancy. She reports a sharp pain in the lower abdomen. She took 1/2 Vicodin that helped the pain go away, but it came back. She then took another 1/2 Vicodin with Tylenol 500 mg later, but it didn't help the pain go away this time. She reports "I only had 2 Vicodin left is why I only took them in halves". She has a F/U appt with [facility] on 03/27/18.       Past Medical History:  Diagnosis Date  . Anxiety   . Bipolar 1 disorder (HCC)   . Depression          Past Surgical History:  Procedure Laterality Date  . LAPAROSCOPIC UNILATERAL SALPINGECTOMY Right 03/16/2018   Procedure: LAPAROSCOPIC RIGHT SALPINGECTOMY FOR MANAGEMENT OF ECTOPIC PREGNANCY;  Surgeon: Lazaro Arms, MD;  Location: [facility];  Service: Gynecology;  Laterality: Right;  . LAPAROSCOPY N/A 03/16/2018   Procedure: LAPAROSCOPY OPERATIVE;  Surgeon: Lazaro Arms, MD;  Location: [facility];  Service: Gynecology;  Laterality: N/A;    No family history on file.  Social History        Tobacco Use  . Smoking status: Current Every Day Smoker    Packs/day: 0.25    Years: 11.00    Pack years: 2.75    Types: Cigarettes  . Smokeless tobacco: Never Used  Substance Use Topics  . Alcohol use: Yes  . Drug use: Yes    Types: Marijuana    Allergies: No Known Allergies         Medications Prior to Admission  Medication Sig Dispense Refill Last Dose  .  benzonatate (TESSALON) 100 MG capsule Take 1 capsule (100 mg total) by mouth 3 (three) times daily as needed for cough. 21 capsule 0 Past Week at Unknown time  . HYDROcodone-acetaminophen (NORCO/VICODIN) 5-325 MG tablet Take 1 tablet by mouth every 6 (six) hours as needed. 15 tablet 0 03/24/2018 at Unknown time  . ibuprofen (ADVIL,MOTRIN) 800 MG tablet Take 1 tablet (800 mg total) by mouth every 8 (eight) hours as needed. 30 tablet 0 03/24/2018 at Unknown time    Review of Systems  Constitutional: Negative.   HENT: Negative.   Eyes: Negative.   Respiratory: Negative.   Cardiovascular: Negative.   Gastrointestinal: Positive for abdominal pain.  Endocrine: Negative.   Genitourinary: Positive for pelvic pain and vaginal bleeding (spotting).  Musculoskeletal: Negative.   Skin: Negative.   Allergic/Immunologic: Negative.   Neurological: Negative.   Hematological: Negative.   Psychiatric/Behavioral: Negative.    Physical Exam   Blood pressure (!) 140/58, pulse 66, temperature 98.9 F (37.2 C), resp. rate 20, height 5\' 2"  (1.575 m), weight 183 lb 3 oz (83.1 kg), last menstrual period 01/24/2018, SpO2 99 %.  Physical Exam  Nursing note and vitals reviewed. Constitutional: She is oriented to person, place, and  time. Vital signs are normal. She appears well-developed and well-nourished. She appears distressed (in fetal position on bed crying and rocking back and forth).  HENT:  Head: Normocephalic and atraumatic.  Eyes: Pupils are equal, round, and reactive to light. Conjunctivae are normal.  Neck: Normal range of motion. Neck supple.  Cardiovascular: Normal rate, regular rhythm and normal heart sounds.  Respiratory: Effort normal and breath sounds normal.  GI: Soft. Bowel sounds are normal. There is tenderness in the left lower quadrant.    Genitourinary:  Genitourinary Comments: Uterus: moderately tender, SE: cervix is smooth, pink, no lesions, scant amt of brown vaginal d/c -- WP,  GC/CT done, closed/long/firm, no CMT or friability, moderate LT adnexal tenderness that goes all the way to midline of abdomen  Musculoskeletal: Normal range of motion.  Neurological: She is alert and oriented to person, place, and time.  Skin: Skin is warm and dry.  Psychiatric: She has a normal mood and affect. Her behavior is normal. Judgment and thought content normal.    MAU Course  Procedures CLINICAL DATA: Recent unilateral salpingectomy for ectopic pregnancy.  EXAM: TRANSVAGINAL OB ULTRASOUND  TECHNIQUE: Transvaginal ultrasound was performed for complete evaluation of the gestation as well as the maternal uterus, adnexal regions, and pelvic cul-de-sac.  COMPARISON: 03/16/18  FINDINGS: Intrauterine gestational sac: None  Yolk sac: Not Visualized.  Embryo: Not Visualized.  Cardiac Activity: Not Visualized.  Maternal uterus/adnexae:  Subchorionic hemorrhage: Not applicable  Right ovary: Appears normal  Left ovary: Appears normal  Other :There is a mass adjacent to the left side of the uterine fundus containing a viable ectopic pregnancy. The crown-rump length measures 11.8 mm corresponding to a 7 week 2 day gestation. Previously 6 weeks 1 day.  Free fluid: There is a moderate amount of free fluid within the pelvis.  IMPRESSION: Again seen is a viable ectopic pregnancy. On today's study this appears adjacent to the left side of the uterine fundus.  Critical Value/emergent results were called by telephone at the time of interpretation on 03/25/2018 at 10:03 am to [facility]ir[facility], who verbally acknowledged these results.   Electronically Signed By: Signa Kellaylor Stroud M.D. On: 03/25/2018 10:04 MDM [facility]UA Pelvic exam Pelvic U/S Percocet 2 tabs po --   LabResultsLast24Hours  Results for orders placed or performed during the hospital encounter of 03/25/18 (from the past 24 hour(s))  Urinalysis, Routine w reflex microscopic      Status: Abnormal   Collection Time: 03/25/18  5:00 AM  Result Value Ref Range   Color, Urine YELLOW YELLOW   APPearance CLEAR CLEAR   Specific Gravity, Urine 1.023 1.005 - 1.030   pH 6.0 5.0 - 8.0   Glucose, UA NEGATIVE NEGATIVE mg/dL   Hgb urine dipstick SMALL (A) NEGATIVE   Bilirubin Urine NEGATIVE NEGATIVE   Ketones, ur 5 (A) NEGATIVE mg/dL   Protein, ur NEGATIVE NEGATIVE mg/dL   Nitrite NEGATIVE NEGATIVE   Leukocytes, UA NEGATIVE NEGATIVE   RBC / HPF 0-5 0 - 5 RBC/hpf   WBC, UA 0-5 0 - 5 WBC/hpf   Bacteria, UA RARE (A) NONE SEEN   Squamous Epithelial / LPF 0-5 (A) NONE SEEN   Mucus PRESENT      Report given to and care assumed by Sharen CounterLisa Leftwich-Kirby, CNM @ [phone]  Raelyn Moraolitta Dawson, MSN, CNM 03/25/2018, 6:00 AM  Assessment and Plan  Second ectopic pregnancy diagnosed after removal of right ectopic last week, c/w bilateral ectopic pregnancy, fetal heartbeat seen. She was counseled that laparoscopy and removal of  the pregnancy is indicated and the tube may be removed. Future pregnancy by IVF is possible..  The risks of surgery were discussed in detail with the patient including but not limited to: bleeding which may require transfusion or reoperation; infection which may require prolonged hospitalization or re-hospitalization and antibiotic therapy; injury to bowel, bladder, ureters and major vessels or other surrounding organs; need for additional procedures including laparotomy; thromboembolic phenomenon, incisional problems and other postoperative or anesthesia complications.  Patient was told that the likelihood that her condition and symptoms will be treated effectively with this surgical management was very high; the postoperative expectations were also discussed in detail. The patient also understands the alternative treatment options which were discussed in full. All questions were answered.      Adam Phenix, MD 03/25/2018

## 2018-03-25 NOTE — Op Note (Signed)
Jane Finley PROCEDURE DATE: 03/25/2018  PREOPERATIVE DIAGNOSIS: Living left ectopic pregnancy POSTOPERATIVE DIAGNOSIS:  left fallopian tube ectopic pregnancy PROCEDURE: Laparoscopic left salpingectomy and removal of ectopic pregnancy SURGEON:  Adam PhenixArnold, Emmanuell Kantz G, MD ANESTHESIOLOGIST: Lowella CurbMiller, Warren Ray, MD Anesthesiologist: Lowella CurbMiller, Warren Ray, MD CRNA: Graciela HusbandsFussell, Wynn O, CRNA  INDICATIONS: 28 y.o. [phone] at 7521w4d here with the preoperative diagnoses as listed above.  Please refer to preoperative notes for more details. Patient was counseled regarding need for laparoscopic salpingectomy. Risks of surgery including bleeding which may require transfusion or reoperation, infection, injury to bowel or other surrounding organs, need for additional procedures including laparotomy and other postoperative/anesthesia complications were explained to patient.  Written informed consent was obtained.  FINDINGS:  Small amount of hemoperitoneum estimated to be about 50 ml of blood and clots.  Dilated left fallopian tube containing ectopic gestation. Small normal appearing uterus, right fallopian tube absent after previous removal of ectopic pregnancy, right ovary and left ovary.  ANESTHESIA: General INTRAVENOUS FLUIDS: 1200 ml ESTIMATED BLOOD LOSS: 50 ml URINE OUTPUT: 100 ml SPECIMENS: Left fallopian tube containing ectopic gestation COMPLICATIONS: None immediate  PROCEDURE IN DETAIL:  The patient was taken to the operating room where general anesthesia was administered and was found to be adequate.  She was placed in the dorsal lithotomy position, and was prepped and draped in a sterile manner.  A Foley catheter was inserted into her bladder and attached to constant drainage and a uterine manipulator was then advanced into the uterus .    After an adequate timeout was performed, attention was turned to the abdomen where an umbilical incision was made with the scalpel.  The 11-mm trocar and sleeve were then  advanced without difficulty  into the abdomen.  The abdomen was then insufflated with carbon dioxide gas and adequate pneumoperitoneum was obtained.  A survey of the patient's pelvis and abdomen revealed the findings above.  Two 5-mm left lower quadrant ports were then placed under direct visualization.  The Nezhat suction irrigator was then used to suction the hemoperitoneum and irrigate the pelvis.  Attention was then turned to the left fallopian tube which was grasped and ligated from the underlying mesosalpinx and uterine attachment using the Harmonic instrument.  Good hemostasis was noted.  The specimen was placed in an EndoCatch bag and removed from the abdomen intact.  The abdomen was desufflated, and all instruments were removed.  The fascial incision of the 10-mm site was reapproximated with a 0 Vicryl figure-of-eight stitch; and all skin incisions were closed with 4-0 Vicryl and Dermabond. The patient tolerated the procedure well.  All instruments, needles, and sponge counts were correct x 2. The patient was taken to the recovery room in stable condition.   The patient will be discharged to home as per PACU criteria.  Routine postoperative instructions given.  She was prescribed Percocet, Ibuprofen and Colace.  She will follow up in the clinic in about 2-3 weeks for postoperative evaluation.   Adam PhenixArnold, Juliahna Wiswell G, MD 03/25/2018 1:53 PM

## 2018-03-25 NOTE — Anesthesia Preprocedure Evaluation (Signed)
Anesthesia Evaluation  Patient identified by MRN, date of birth, ID band Patient awake    Reviewed: Allergy & Precautions, NPO status , Patient's Chart, lab work & pertinent test results  Airway Mallampati: II  TM Distance: >3 FB Neck ROM: Full    Dental no notable dental hx. (+) Teeth Intact   Pulmonary Current Smoker,    Pulmonary exam normal breath sounds clear to auscultation       Cardiovascular negative cardio ROS Normal cardiovascular exam Rhythm:Regular Rate:Normal     Neuro/Psych PSYCHIATRIC DISORDERS Anxiety Depression Bipolar Disorder PTSDnegative neurological ROS     GI/Hepatic negative GI ROS, (+)     substance abuse  , Hx/o polysubstance abuse   Endo/Other  Obesity  Renal/GU negative Renal ROS  negative genitourinary   Musculoskeletal negative musculoskeletal ROS (+)   Abdominal (+) + obese,  Abdomen: tender.    Peds  Hematology negative hematology ROS (+)   Anesthesia Other Findings   Reproductive/Obstetrics (+) Pregnancy Right ectopic pregnancy                             Anesthesia Physical  Anesthesia Plan  ASA: II and emergent  Anesthesia Plan: General   Post-op Pain Management:    Induction: Intravenous  PONV Risk Score and Plan: 2 and Midazolam, Ondansetron and Treatment may vary due to age or medical condition  Airway Management Planned: Oral ETT  Additional Equipment:   Intra-op Plan:   Post-operative Plan: Extubation in OR  Informed Consent: I have reviewed the patients History and Physical, chart, labs and discussed the procedure including the risks, benefits and alternatives for the proposed anesthesia with the patient or authorized representative who has indicated his/her understanding and acceptance.   Dental advisory given  Plan Discussed with: CRNA, Anesthesiologist and Surgeon  Anesthesia Plan Comments:         Anesthesia  Quick Evaluation

## 2018-03-25 NOTE — Progress Notes (Signed)
CSW met with patient in PACU to follow up from earlier conversation in MAU prior to surgery.  Patient is extremely upset about her situation.  CSW feels the magnitude of the situation that she will not be able to conceive is starting to set in.  Prior to meeting with patient, CSW spoke with Chaplain/A. Davee-Lomax who states she has spoken to patient and is also concerned about her emotional state at this time.   Patient reports that she does not have anyone to help her tonight because although she can go to her brother's home, his wife is pregnant and "could go any moment."  She states her due date is Thursday.  CSW is concerned that being in the home with her brother's pregnant wife may not be positive for her emotional state at this time also.  Patient states there are children there and she is not aware of what the plan is for their care when their parents go to the hospital and she was not a part of that conversation and the plan was never for her to be at their home.  She understands that her sister-in-law may not go into labor tonight, but she is fearful of what she will do if she is at home alone should this happen.  She states she went to her friend's house after her surgery a week ago, but feels she would burden this friend if she asked to go there again and she felt this was a strain on her.  The friend is pregnant and has a young child.  CSW asked patient what she will do after tonight if she was to stay in the hospital overnight, as her support system will not change.  Patient states that she is not sure how she will feel given this is her second surgery in less than two weeks, but states she felt much better the second day than the first after her last surgery.  She is hopeful that will be the case again and is solely concerned about being cared for tonight.  CSW spoke with Dr. Roselie Awkward and advocated that patient be admitted tonight for her physical, emotional, and mental health.  This will also allow  CSW and Chaplain to follow up to provide additional support and follow up resources.  CSW has contacted [employer] and [employer] to request assistance for patient's financial dilemma due to being out of work and hopes that there will be some information from these agencies tomorrow.

## 2018-03-25 NOTE — Progress Notes (Signed)
Dressings to bilateral lower abdomen & umbilicus removed.  Incisions clean, dry, and intact.  Staples noted & incisions well aprroximated.

## 2018-03-25 NOTE — MAU Note (Signed)
Came in by EMS . Patient had s/p lap. Salpingectomy on 3/18. Started having constant sharp pain at the lower abdomen area yesterday. Took half tablet of Vicodin, helped the pain but pain came back again. Took another half tablet of Vicodin and 500 mg tylenol but pain did not go away.

## 2018-03-25 NOTE — Progress Notes (Signed)
CSW notified Chaplain/K. Claussen of patient's situation, but that patient is currently in surgery.

## 2018-03-25 NOTE — Transfer of Care (Signed)
Immediate Anesthesia Transfer of Care Note  Patient: Jane Finley  Procedure(s) Performed: LAPAROSCOPY DIAGNOSTIC (N/A Abdomen) UNILATERAL SALPINGECTOMY (Left Abdomen)  Patient Location: PACU  Anesthesia Type:General  Level of Consciousness: awake, alert  and oriented  Airway & Oxygen Therapy: Patient Spontanous Breathing and Patient connected to nasal cannula oxygen  Post-op Assessment: Report given to RN and Post -op Vital signs reviewed and stable  Post vital signs: Reviewed and stable  Last Vitals:  Vitals Value Taken Time  BP 113/67 03/25/2018  2:00 PM  Temp 36.5 C 03/25/2018  2:00 PM  Pulse 80 03/25/2018  2:09 PM  Resp 17 03/25/2018  2:09 PM  SpO2 100 % 03/25/2018  2:09 PM  Vitals shown include unvalidated device data.  Last Pain:  Vitals:   03/25/18 1400  PainSc: Asleep         Complications: No apparent anesthesia complications

## 2018-03-25 NOTE — Progress Notes (Signed)
Staples removed intact from lower abdominal incisions & incision near umbilicus.

## 2018-03-25 NOTE — Anesthesia Postprocedure Evaluation (Signed)
Anesthesia Post Note  Patient: Jane Finley  Procedure(s) Performed: LAPAROSCOPY DIAGNOSTIC (N/A Abdomen) UNILATERAL SALPINGECTOMY (Left Abdomen)     Patient location during evaluation: PACU Anesthesia Type: General Level of consciousness: awake and alert Pain management: pain level controlled Vital Signs Assessment: post-procedure vital signs reviewed and stable Respiratory status: spontaneous breathing, nonlabored ventilation and respiratory function stable Cardiovascular status: blood pressure returned to baseline and stable Postop Assessment: no apparent nausea or vomiting Anesthetic complications: no    Last Vitals:  Vitals:   03/25/18 1445 03/25/18 1500  BP: (!) 103/57 (!) 114/54  Pulse: 73 70  Resp: 17 16  Temp:    SpO2: 97% 99%    Last Pain:  Vitals:   03/25/18 1500  PainSc: 4    Pain Goal:                 Jane Finley

## 2018-03-25 NOTE — MAU Provider Note (Addendum)
History     CSN: [id number]  Arrival date and time: 03/25/18 0453   First Provider Initiated Contact with Patient 03/25/18 0547      No chief complaint on file.  HPI  Ms.  Jane Finley is a 28 y.o. year old G21P1011 female at [redacted]w[redacted]d weeks gestation who presents to MAU via EMS reporting she had lap RT salpingectomy to remove ectopic pregnancy. She reports a sharp pain in the lower abdomen. She took 1/2 Vicodin that helped the pain go away, but it came back. She then took another 1/2 Vicodin with Tylenol 500 mg later, but it didn't help the pain go away this time. She reports "I only had 2 Vicodin left is why I only took them in halves". She has a F/U appt with [facility] on 03/27/18.   Past Medical History:  Diagnosis Date  . Anxiety   . Bipolar 1 disorder (HCC)   . Depression     Past Surgical History:  Procedure Laterality Date  . LAPAROSCOPIC UNILATERAL SALPINGECTOMY Right 03/16/2018   Procedure: LAPAROSCOPIC RIGHT SALPINGECTOMY FOR MANAGEMENT OF ECTOPIC PREGNANCY;  Surgeon: Lazaro Arms, MD;  Location: [facility];  Service: Gynecology;  Laterality: Right;  . LAPAROSCOPY N/A 03/16/2018   Procedure: LAPAROSCOPY OPERATIVE;  Surgeon: Lazaro Arms, MD;  Location: [facility];  Service: Gynecology;  Laterality: N/A;    No family history on file.  Social History   Tobacco Use  . Smoking status: Current Every Day Smoker    Packs/day: 0.25    Years: 11.00    Pack years: 2.75    Types: Cigarettes  . Smokeless tobacco: Never Used  Substance Use Topics  . Alcohol use: Yes  . Drug use: Yes    Types: Marijuana    Allergies: No Known Allergies  Medications Prior to Admission  Medication Sig Dispense Refill Last Dose  . benzonatate (TESSALON) 100 MG capsule Take 1 capsule (100 mg total) by mouth 3 (three) times daily as needed for cough. 21 capsule 0 Past Week at Unknown time  . HYDROcodone-acetaminophen (NORCO/VICODIN) 5-325 MG tablet Take 1 tablet by mouth every 6 (six) hours as needed. 15  tablet 0 03/24/2018 at Unknown time  . ibuprofen (ADVIL,MOTRIN) 800 MG tablet Take 1 tablet (800 mg total) by mouth every 8 (eight) hours as needed. 30 tablet 0 03/24/2018 at Unknown time    Review of Systems  Constitutional: Negative.   HENT: Negative.   Eyes: Negative.   Respiratory: Negative.   Cardiovascular: Negative.   Gastrointestinal: Positive for abdominal pain.  Endocrine: Negative.   Genitourinary: Positive for pelvic pain and vaginal bleeding (spotting).  Musculoskeletal: Negative.   Skin: Negative.   Allergic/Immunologic: Negative.   Neurological: Negative.   Hematological: Negative.   Psychiatric/Behavioral: Negative.    Physical Exam   Blood pressure (!) 140/58, pulse 66, temperature 98.9 F (37.2 C), resp. rate 20, height 5\' 2"  (1.575 m), weight 183 lb 3 oz (83.1 kg), last menstrual period 01/24/2018, SpO2 99 %.  Physical Exam  Nursing note and vitals reviewed. Constitutional: She is oriented to person, place, and time. Vital signs are normal. She appears well-developed and well-nourished. She appears distressed (in fetal position on bed crying and rocking back and forth).  HENT:  Head: Normocephalic and atraumatic.  Eyes: Pupils are equal, round, and reactive to light. Conjunctivae are normal.  Neck: Normal range of motion. Neck supple.  Cardiovascular: Normal rate, regular rhythm and normal heart sounds.  Respiratory: Effort normal and breath sounds normal.  GI: Soft. Bowel sounds are normal. There is tenderness in the left lower quadrant.    Genitourinary:  Genitourinary Comments: Uterus: moderately tender, SE: cervix is smooth, pink, no lesions, scant amt of brown vaginal d/c -- WP, GC/CT done, closed/long/firm, no CMT or friability, moderate LT adnexal tenderness that goes all the way to midline of abdomen  Musculoskeletal: Normal range of motion.  Neurological: She is alert and oriented to person, place, and time.  Skin: Skin is warm and dry.    Psychiatric: She has a normal mood and affect. Her behavior is normal. Judgment and thought content normal.    MAU Course  Procedures CLINICAL DATA:  Recent unilateral salpingectomy for ectopic pregnancy.  EXAM: TRANSVAGINAL OB ULTRASOUND  TECHNIQUE: Transvaginal ultrasound was performed for complete evaluation of the gestation as well as the maternal uterus, adnexal regions, and pelvic cul-de-sac.  COMPARISON:  03/16/18  FINDINGS: Intrauterine gestational sac: None  Yolk sac:  Not Visualized.  Embryo:  Not Visualized.  Cardiac Activity: Not Visualized.  Maternal uterus/adnexae:  Subchorionic hemorrhage: Not applicable  Right ovary: Appears normal  Left ovary: Appears normal  Other :There is a mass adjacent to the left side of the uterine fundus containing a viable ectopic pregnancy. The crown-rump length measures 11.8 mm corresponding to a 7 week 2 day gestation. Previously 6 weeks 1 day.  Free fluid: There is a moderate amount of free fluid within the pelvis.  IMPRESSION: Again seen is a viable ectopic pregnancy. On today's study this appears adjacent to the left side of the uterine fundus.  Critical Value/emergent results were called by telephone at the time of interpretation on 03/25/2018 at 10:03 am to [facility] Left[facility]-Kirby, who verbally acknowledged these results.   Electronically Signed   By: Signa Kellaylor  Stroud M.D.   On: 03/25/2018 10:04 MDM [facility] Pelvic exam Pelvic U/S Percocet 2 tabs po --   Results for orders placed or performed during the hospital encounter of 03/25/18 (from the past 24 hour(s))  Urinalysis, Routine w reflex microscopic     Status: Abnormal   Collection Time: 03/25/18  5:00 AM  Result Value Ref Range   Color, Urine YELLOW YELLOW   APPearance CLEAR CLEAR   Specific Gravity, Urine 1.023 1.005 - 1.030   pH 6.0 5.0 - 8.0   Glucose, UA NEGATIVE NEGATIVE mg/dL   Hgb urine dipstick SMALL (A) NEGATIVE   Bilirubin  Urine NEGATIVE NEGATIVE   Ketones, ur 5 (A) NEGATIVE mg/dL   Protein, ur NEGATIVE NEGATIVE mg/dL   Nitrite NEGATIVE NEGATIVE   Leukocytes, UA NEGATIVE NEGATIVE   RBC / HPF 0-5 0 - 5 RBC/hpf   WBC, UA 0-5 0 - 5 WBC/hpf   Bacteria, UA RARE (A) NONE SEEN   Squamous Epithelial / LPF 0-5 (A) NONE SEEN   Mucus PRESENT    Report given to and care assumed by Sharen CounterLisa Leftwich-Kirby, CNM @ [phone]  Raelyn Moraolitta Dawson, MSN, CNM 03/25/2018, 6:00 AM  Assessment and Plan  Second ectopic pregnancy diagnosed after removal of right ectopic last week, c/w bilateral ectopic pregnancy, fetal heartbeat seen. She was counseled that laparoscopy and removal of the pregnancy is indicated and the tube may be removed. Future pregnancy by IVF is possible..  The risks of surgery were discussed in detail with the patient including but not limited to: bleeding which may require transfusion or reoperation; infection which may require prolonged hospitalization or re-hospitalization and antibiotic therapy; injury to bowel, bladder, ureters and major vessels or other surrounding organs; need for  additional procedures including laparotomy; thromboembolic phenomenon, incisional problems and other postoperative or anesthesia complications.  Patient was told that the likelihood that her condition and symptoms will be treated effectively with this surgical management was very high; the postoperative expectations were also discussed in detail. The patient also understands the alternative treatment options which were discussed in full. All questions were answered.      Adam Phenix, MD 03/25/2018

## 2018-03-25 NOTE — Progress Notes (Signed)
CSW received call from MAU RN to come support patient after getting news of second ectopic pregnancy in two weeks.  Staff also state patient has financial concerns about being out of work.  CSW met with patient and her brother.  She gave permission to speak openly with him present and she seems grateful for his presence.  Patient states she is emotional about the situation, but most concerned about losing her housing due to being unable to work after surgery.  CSW offered support.  She states she works for [employer] at [employer] and does not have any paid time off.  She has worked there for two years.  She states she rents a room at a boarding house and that her landlord has already been working with her, since she is behind on her rent.  CSW asked if she has applied for emergency financial assistance through [employer] or [employer] within the last 6 months and she said she has not.  CSW recommends this and will write a letter on her behalf.  CSW will also reach out to a few foundations who may be able to assist.  Patient requests to speak with someone about Medicaid so CSW spoke with [place] Counselor to request that someone follow up with patient after surgery regarding her questions.  Patient was appreciative.  CSW asked RN to pass along message to notify CSW once patient is alert in PACU or admitted to the floor so that CSW can follow up.

## 2018-03-25 NOTE — Anesthesia Procedure Notes (Signed)
Procedure Name: Intubation Date/Time: 03/25/2018 12:48 PM Performed by: Lowella CurbMiller, Warren Ray, MD Pre-anesthesia Checklist: Patient identified, Emergency Drugs available, Suction available, Patient being monitored and Timeout performed Patient Re-evaluated:Patient Re-evaluated prior to induction Oxygen Delivery Method: Circle system utilized Preoxygenation: Pre-oxygenation with 100% oxygen Induction Type: IV induction and Rapid sequence Laryngoscope Size: Miller and 2 Grade View: Grade I Tube type: Oral Tube size: 7.0 mm Number of attempts: 1 Airway Equipment and Method: Stylet Placement Confirmation: ETT inserted through vocal cords under direct vision,  positive ETCO2,  CO2 detector and breath sounds checked- equal and bilateral Secured at: 21 cm Tube secured with: Tape Dental Injury: Teeth and Oropharynx as per pre-operative assessment

## 2018-03-25 NOTE — Discharge Instructions (Signed)
Ectopic Pregnancy °An ectopic pregnancy happens when a fertilized egg grows outside the uterus. A pregnancy cannot live outside of the uterus. This problem often happens in the fallopian tube. It is often caused by damage to the fallopian tube. °If this problem is found early, you may be treated with medicine. If your tube tears or bursts open (ruptures), you will bleed inside. This is an emergency. You will need surgery. Get help right away. °What are the signs or symptoms? °You may have normal pregnancy symptoms at first. These include: °· Missing your period. °· Feeling sick to your stomach (nauseous). °· Being tired. °· Having tender breasts. ° °Then, you may start to have symptoms that are not normal. These include: °· Pain with sex (intercourse). °· Bleeding from the vagina. This includes light bleeding (spotting). °· Belly (abdomen) or lower belly cramping or pain. This may be felt on one side. °· A fast heartbeat (pulse). °· Passing out (fainting) after going poop (bowel movement). ° °If your tube tears, you may have symptoms such as: °· Really bad pain in the belly or lower belly. This happens suddenly. °· Dizziness. °· Passing out. °· Shoulder pain. ° °Get help right away if: °You have any of these symptoms. This is an emergency. °This information is not intended to replace advice given to you by your health care provider. Make sure you discuss any questions you have with your health care provider. °Document Released: 03/15/2009 Document Revised: 05/24/2016 Document Reviewed: 07/29/2013 °Elsevier Interactive Patient Education © 2017 [employer] Inc. ° °

## 2018-03-25 NOTE — Progress Notes (Signed)
Subjective: Patient reports incisional pain and tolerating PO.    Objective: I have reviewed patient's vital signs, intake and output and medications.  General: alert, cooperative and mild distress GI: soft, non-tender; bowel sounds normal; no masses,  no organomegaly and incision: clean, dry and intact Extremities: extremities normal, atraumatic, no cyanosis or edema   Assessment/Plan: Post operative pain, will observe overnight  LOS: 0 days    Jane DarterJames Ben Finley 03/25/2018, 4:14 PM

## 2018-03-26 ENCOUNTER — Encounter (HOSPITAL_COMMUNITY): Payer: Self-pay | Admitting: Obstetrics & Gynecology

## 2018-03-26 MED ORDER — HYDROCODONE-ACETAMINOPHEN 5-325 MG PO TABS: 1.0000 | ORAL_TABLET | Freq: Four times a day (QID) | ORAL | 0 refills | Status: DC | PRN

## 2018-03-26 MED FILL — HYDROCODON-APAP 5-325: 5-325 | 2 days supply | Qty: 20 | Fill #0

## 2018-03-26 NOTE — Discharge Summary (Signed)
Physician Discharge Summary  Patient ID: Jane Finley MRN: [id number] DOB/AGE: 03-07-1990 28 y.o.  Admit date: 03/25/2018 Discharge date: 03/26/2018  Admission Diagnoses:  Discharge Diagnoses:  Active Problems:   Ectopic pregnancy, tubal   Postoperative state   Discharged Condition: good  Hospital Course: Ms.Jane Jacksonis a 28 y.o.year old G32P1011 female at [redacted]w[redacted]d weeks gestation who presents to MAU via EMSreportingshe had lap RT salpingectomy to remove ectopic pregnancy.She reports a sharp pain in the lower abdomen. She took 1/2 Vicodin that helped the pain go away, but it came back. She then took another 1/2 Vicodin with Tylenol 500 mg later, but it didn't help the pain go away this time. She reports "I only had 2 Vicodin left is why I only took them in halves". She has a F/U appt with [facility] on 03/27/18. Second ectopic pregnancy diagnosed after removal of right ectopic last week, c/w bilateral ectopic pregnancy, fetal heartbeat seen. She was counseled that laparoscopy and removal of the pregnancy is indicated Consults: None  Significant Diagnostic Studies: radiology: Ultrasound: Pelvic US CLINICAL DATA: Recent unilateral salpingectomy for ectopic pregnancy.  EXAM: TRANSVAGINAL OB ULTRASOUND  TECHNIQUE: Transvaginal ultrasound was performed for complete evaluation of the gestation as well as the maternal uterus, adnexal regions, and pelvic cul-de-sac.  COMPARISON: 03/16/18  FINDINGS: Intrauterine gestational sac: None  Yolk sac: Not Visualized.  Embryo: Not Visualized.  Cardiac Activity: Not Visualized.  Maternal uterus/adnexae:  Subchorionic hemorrhage: Not applicable  Right ovary: Appears normal  Left ovary: Appears normal  Other :There is a mass adjacent to the left side of the uterine fundus containing a viable ectopic pregnancy. The crown-rump length measures 11.8 mm corresponding to a 7 week 2 day gestation. Previously 6 weeks 1  day.  Free fluid: There is a moderate amount of free fluid within the pelvis.  IMPRESSION: Again seen is a viable ectopic pregnancy. On today's study this appears adjacent to the left side of the uterine fundus.  Critical Value/emergent results were called by telephone at the time of interpretation on 03/25/2018 at 10:03 am to [facility], who verbally acknowledged these results.   Electronically Signed By: Signa Kell M.D. On: 03/25/2018 10:04 Treatments: surgery: laparoscopy and left salpingectomy  Discharge Exam: Blood pressure 114/65, pulse 73, temperature 98.5 F (36.9 C), temperature source Oral, resp. rate 18, height 5\' 2"  (1.575 m), weight 83.1 kg (183 lb 3 oz), last menstrual period 01/24/2018, SpO2 100 %. General appearance: alert, cooperative and no distress Throat: abnormal findings: small area of erythema on uvula Skin: fine diffuse papular rash neck and upper shoulders  Disposition: Discharge disposition: 01-Home or Self Care       Discharge Instructions    Discharge patient   Complete by:  As directed    Discharge disposition:  01-Home or Self Care   Discharge patient date:  03/25/2018     Allergies as of 03/26/2018   No Known Allergies     Medication List    TAKE these medications   benzonatate 100 MG capsule Commonly known as:  TESSALON Take 1 capsule (100 mg total) by mouth 3 (three) times daily as needed for cough.   HYDROcodone-acetaminophen 5-325 MG tablet Commonly known as:  NORCO/VICODIN Take 1 tablet by mouth every 6 (six) hours as needed. What changed:  Another medication with the same name was added. Make sure you understand how and when to take each.   HYDROcodone-acetaminophen 5-325 MG tablet Commonly known as:  NORCO/VICODIN Take 1-2 tablets by mouth every 6 (six)  hours as needed for moderate pain. What changed:  You were already taking a medication with the same name, and this prescription was added. Make sure  you understand how and when to take each.   ibuprofen 800 MG tablet Commonly known as:  ADVIL,MOTRIN Take 1 tablet (800 mg total) by mouth every 8 (eight) hours as needed.      Follow-up Information    [facility]   Specialty:  Obstetrics and Gynecology Contact information: [address] [place] [id number] [phone]          Signed: Scheryl DarterJames Dorsel Flinn 03/26/2018, 9:44 AM

## 2018-03-26 NOTE — Care Management (Signed)
CM reviewing chart and patient is self pay.  Verified with with Burman Fostereyna -Artistfinancial counselor.  Burman FosterReyna said that Medicaid is pending.  Patient is not eligible for MATCH because medication is narcotic and over the counter.  CM called [facility] and price is $20.65 for her (Norco/Vicodin); and CM called [facility] and spoke to [place]- pharmacist and price is $9.00.  CM spoke to CSW and patient is very limited with resources.  CM called [facility] and prescription was cancelled and Dr. Debroah LoopArnold sent new (Norco/Vicodin) [facility] # [phone] and patient can buy over the counter motrin there a bottle quantity of (100)- 200 mg strength for $1.96.  Reviewed this with patient and patient's RN.  CSW in room and working with patient regarding transportation to get medication, other community resources and Chaplain in room to provide support.  Please call CM for any other needs.

## 2018-03-26 NOTE — Progress Notes (Signed)
CSW followed up with patient to offer ongoing support.  Patient was extremely tearful as she shared with CSW about her life/trauma she has experienced, as well as how she is beginning to process the loss of ability to ever have children again.  She spoke about the heartache of losing custody of her first daughter when she was 28 years old and the hope that her daughter will return to her one day.  She spoke about never being able to "break the cycle," and explained that she grew up without her mother, now her daughter is growing up without her mother and how she wanted to have a child who grew up with his/her mother.  Chaplain/K. Claussen was present for most of the discussion and added support as well.  Patient was so thankful for the support offered and seemed genuinely appreciative of the time and space allowed for her to share her story and begin to grieve her current losses.  At one point she stated that she is tired of life and tired of trying.  CSW asked her to elaborate on these statements and assessed for safety.  She reports that she is not feeling suicidal and mentioned that she would not be afraid to say if she was.  She contracted for safety easily.  CSW strongly recommends finding a therapist to help her heal from her past and help her plan for her future.  Patient was somewhat receptive, but hesitant given past experiences in counseling as well.  CSW mentioned how she was able to open up to CSW and encouraged her to find an outpatient provider with whom she feels comfortable.  CSW also spoke about medication and patient reports that she has been on medication most of her life.  CSW recommends [facility] as they have walk in clinics, counseling and medication management all in one and also have IPRS funding for those who are uninsured.  Patient states she will consider.   CSW gathered information to make referrals for financial assistance, as CSW has received  word back from [employer] as well as [employer].  Application completed for the first and phone call left [facility] per recommendation from [facility] staff as they are not able to help with rent to a boarding house.  Patient was grateful for the assistance and understands that it is not a guarantee.  CSW spoke with the management company/[employer]. [employer]/[phone], who provided information on how much patient owes in back rent and states he is willing to continue working with patient if she continues to put forth effort.   Patient states her brother and his wife are here now for labor and delivery, but that she has spoken with her brother and he is willing to "run me home."  She [place] friend in [place] who will transfer her $50 to help her with basic needs while she is out of work and pay for her medications.  Patient states [facility] and go get her prescriptions from the pharmacy after that if her brother is available.  Patient again was thankful of support from CSW and Chaplains.   No barriers to discharge.

## 2018-03-26 NOTE — Care Management (Signed)
Addendum: Per CSW patient is contacting friend who is sending her money to pay for medication for discharge.

## 2018-03-26 NOTE — Progress Notes (Signed)
Rn reviewed discharge instructions with patient.  Discussed follow up appointment tomorrow and medication changes. Rn advised patient of medication administration.  Patient verbalizes understanding.  Paperwork signed. IV removed.

## 2018-03-27 ENCOUNTER — Encounter: Payer: Self-pay | Admitting: Obstetrics & Gynecology

## 2018-03-28 NOTE — Progress Notes (Signed)
This is a late entry for a visit that took place on 3/27 with pt.  Lulu Ridingolleen Shaw, LCSW was also present for parts of this conversation to offer support and resources. Levada SchillingBryana was initially hesitant to share with me because it was so painful to talk about her losses, but she began to open up about her experience over the last 2 weeks with 2 ectopic pregnancies and the loss of both of her tubes.  She described herself as "less of a woman" because she would not be able to get pregnant.  This also further brought up feelings about her ability to find a future partner.  She had hoped her whole life to have a stable family (spouse and children) and she thought that FOB would be that man, but he has been unsupportive during the last several weeks while she has been going through this.    We spoke about her limited support--she does have 2-3 good friends, another person who is "like a mother" to her and a "brother" who is a friend from work who took her in when she lost her home.  Her "brother's" wife went into labor and was at the hospital at the same time.  She is grateful for their support, but does not want to impose.  Her financial resources are limited and that has been a significant stress for her as well.  She works for [employer] at [employer].    She also has limited coping skills, she has been on medication for her mental health since she was 11, but is not currently on the medication because it made her too drowsy to be functional.  She currently self-medicates with marijuana daily to cope with her emotional and psychological pain. I encouraged her to speak with her doctor about potentially switching to a different medication to help her.  She stated that she would consider this.  She has not had good counseling experiences in the past, but was open when social worker brought her counseling referrals.  In addition to those CSW brought, I brought her information from [facility].  She is  grieving the loss of her 2 babies in 2 weeks time, the loss of custody of her 26 year old and her [place] to get [place] [facility] [place] her as well as the loss of future babies.  Colleen and I offered her memory and keepsake items including certificates of life to honor her babies.  She became tearful and was so appreciative and deeply touched by the items we gave her.  She named her children Ruthann CancerGregory  Brandon Carter Jr (birthdate, 3/17) and Konrad Felixracy Lynn Teryl LucyJackson Carter (birthdate, [birth year]/26).     We provided her with grief resources and I gave her my card for follow-up grief support.  She gave verbal consent for me to call her next week to check in on her as well as permission to leave a message if I don't reach her.  Chaplain Dyanne CarrelKaty Mikeya Tomasetti, Bcc Pager, [phone] 12:23 PM   03/28/18 1200  Clinical Encounter Type  Visited With Patient;Health care provider  Visit Type Spiritual support  Referral From Chaplain;Social work  Stress Factors  Patient Stress Factors Exhausted;Family relationships;Financial concerns;Health changes;Lack of caregivers;Loss;Loss of control;Major life changes

## 2018-03-31 ENCOUNTER — Ambulatory Visit (INDEPENDENT_AMBULATORY_CARE_PROVIDER_SITE_OTHER): Payer: Self-pay | Admitting: Obstetrics & Gynecology

## 2018-03-31 ENCOUNTER — Encounter: Payer: Self-pay | Admitting: Obstetrics & Gynecology

## 2018-03-31 ENCOUNTER — Ambulatory Visit: Payer: Federal, State, Local not specified - Other | Admitting: Clinical

## 2018-03-31 VITALS — BP 113/47 | HR 54 | Wt 183.8 lb

## 2018-03-31 DIAGNOSIS — F4321 Adjustment disorder with depressed mood: Secondary | ICD-10-CM

## 2018-03-31 DIAGNOSIS — Z9889 Other specified postprocedural states: Secondary | ICD-10-CM

## 2018-03-31 DIAGNOSIS — Z8632 Personal history of gestational diabetes: Secondary | ICD-10-CM

## 2018-03-31 NOTE — BH Specialist Note (Signed)
Integrated Behavioral Health Initial Visit  5 minute visit only  MRN: [id number] Name: Jane Finley  Number of Integrated Behavioral Health Clinician visits:: 1/6 Session Start time: 9:47 Session End time: 9:52 Total time: 5 minute  Type of Service: Integrated Behavioral Health- Individual/Family Interpretor:No. Interpretor Name and Language: n/a   Warm Hand Off Completed.       SUBJECTIVE: Jane Finley is a 28 y.o. female accompanied by n/a Patient was referred by Dr Marice Potterove for grief reaction. Patient reports the following symptoms/concerns: Pt states she wants to talk to the grief counselor she spoke to previously at the hospital, and is requesting note to take to work. Pt has no other concerns at the time. Duration of problem: Today; Severity of problem: mild  OBJECTIVE: Mood: Depressed and Affect: Depressed and Tearful Risk of harm to self or others: No plan to harm self or others  LIFE CONTEXT: Family and Social: - School/Work: Working Self-Care: - Life Changes: Double ectopic pregnancy  GOALS ADDRESSED: Patient will:  1. Demonstrate ability to: Begin healthy grieving over loss  INTERVENTIONS: Interventions utilized: Supportive Counseling and Link to [facility]  Standardized Assessments completed: Not Needed  ASSESSMENT: Patient currently experiencing Grief.   Patient may benefit from brief supportive counseling and referral to appropriate grief support.  PLAN: 1. Follow up with behavioral health clinician on : As needed 2. Behavioral recommendations:  -Call hospital chaplain today, to set up a time to talk -Consider [facility] grief support services, if needed 3. Referral(s): [facility] 4. "From scale of 1-10, how likely are you to follow plan?": 10  Rae LipsJamie C McMannes, LCSW   Depression screen [facility] 03/31/2018  Decreased Interest 1  Down, Depressed, Hopeless 0  PHQ - 2 Score 1  Altered sleeping 1  Tired, decreased energy 1  Change in  appetite 0  Feeling bad or failure about yourself  0  Trouble concentrating 0  Moving slowly or fidgety/restless 0  Suicidal thoughts 0  PHQ-9 Score 3   GAD 7 : Generalized Anxiety Score 03/31/2018  Nervous, Anxious, on Edge 0  Control/stop worrying 0  Worry too much - different things 1  Trouble relaxing 0  Restless 0  Easily annoyed or irritable 1  Afraid - awful might happen 0  Total GAD 7 Score 2

## 2018-03-31 NOTE — Progress Notes (Signed)
   Subjective:    Patient ID: Jane Finley, female    DOB: 1990/09/04, 28 y.o.   MRN: [id number]  HPI 28 yo single P401 (26 year old daughter) here for a post op visit. She had a left ectopic pregnancy with removal of her tube on 03-16-18 and then a right salpingectomy a week later for an ectopic in that side. She had a h/o + chlamydia, gonorrhea, and trich.   Review of Systems She had GDM in her last pregnancy. She works at [employer], does catering    Objective:   Physical Exam  Breathing, conversing, and ambulating normally Well nourished, well hydrated White female, no apparent distress Abd- benign Incisions healed well      Assessment & Plan:  Post op stable She may return to work She declines speaking with Jamie  Check HBA1C Phone number given for free pap clinic

## 2018-04-01 ENCOUNTER — Telehealth: Payer: Self-pay | Admitting: Obstetrics & Gynecology

## 2018-04-01 LAB — HEMOGLOBIN A1C
ESTIMATED AVERAGE GLUCOSE: 91 mg/dL
HEMOGLOBIN A1C: 4.8 % (ref 4.8–5.6)

## 2018-04-01 NOTE — Telephone Encounter (Signed)
Patient called asking to speak directly to Dr Despina HiddenEure regarding surgery that he performed on her 2 weeks ago.. Asked patient if there was anything I could help her with and patient stated she did not want to go into detail with me and there was nothing that I could help her with. Stated she had multiple questions concerning what he did and would only talk with him. PLease call patient.

## 2018-04-01 NOTE — Telephone Encounter (Signed)
I just got off the phone having returned a phone call from Jane Finley regarding her highly unusual presentation and management for bilateral ectopic pregnancies  I saw the patient originally on 03/17/2018 as a transfer from [facility] with a viable right ectopic pregnancy I reviewed the sonogram myself reviewed the images myself and based on the sonogram was in total agreement that she had an ectopic pregnancy in her right fallopian tube The remainder of the ultrasound was normal except for some free fluid in the pelvis consistent with rupture  The patient underwent a laparoscopic right salpingectomy and removal of ectopic pregnancy which was relatively unremarkable.  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  I informed the patient that it would be highly unlikely short of the miraculous for her to ever be able to get pregnant in the left fallopian tube and she would need to inquire regarding in vitro fertilization.  In fact informed her on Monday morning before she went home that there were studies that showed that removal of the left fallopian tube with hydrosalpinx would actually improve her ability to be pregnant even if she had IVF.  She was obviously upset about that fact but understood that I was giving her the information based on the laparoscopic findings.  Subsequently and shockingly she presented 9 days later to [facility] maternity admission unit and was found to have an ectopic pregnancy also on the left.  The literature suggests that the incidence of this is between 1 in 25,000 and 1in  200,000 based on the study.  Dr. Debroah Loop called me and inform me of this fact and he performed the surgery himself which was a left salpingectomy also performed laparoscopically.  Jane Finley was calling today to again just ask questions regarding how this came about and to confirm what I had  told her regarding the findings at the time of surgery.  I summarized essentially what I summarized above and also told her what the left fallopian tube looked like at the time of her laparoscopic surgery with me.  I can understand her confusion and her overall disappointment.  However nothing unfortunately changes the fact that she had bilateral ectopic pregnancies, the first of which was found on the right from a sonogram from her first hospital presentation and the second found on the the left at the time of her second presentation.  The pathology confirmed ectopic pregnancy each time with chorionic villi found.    I went over again what the findings of her fallopian tube on the left were at the time and she seems surprised that I was unable to determine at the time that there was an ectopic pregnancy also in the left fallopian tube.  I told her that it was certainly dilated and consistent with a large hydrosalpinx but it was not hyperemic and was not consistent at that time with an ectopic pregnancy on the left as well.  If it had had that appearance I am not sure if I would have done anything surgically about it at that time anyway unless it was ruptured because it would have been completely a surprise and if I would have removed it at the time that would have also been quite stunning and disappointing.  But at the time of the original laparoscopy I did not suspect and there were no findings consistent with an ectopic pregnancy on the  left just a significant hydrosalpinx with blunted fimbria.  We ended the conversation by stating that I hope that answer all her questions I certainly understood the fact that she was disappointed that this had happened but unfortunately nothing could change the fact that she had bilateral ectopic pregnancies.  I do not think there is anything I could have done preoperatively or intraoperatively different given the preoperative evaluation with sonogram and the intraoperative  findings at the time of surgery.  She stated she understood this she appreciated my time and I told her that if she had any more questions it would be great if she could send me my chart questions so I could reply and we can have a [employer] archive of our conversation she stated she would.  Use that venue if needed.

## 2018-04-04 ENCOUNTER — Encounter: Payer: Self-pay | Admitting: Obstetrics & Gynecology

## 2018-04-10 ENCOUNTER — Ambulatory Visit (INDEPENDENT_AMBULATORY_CARE_PROVIDER_SITE_OTHER): Payer: Self-pay | Admitting: General Practice

## 2018-04-10 VITALS — BP 90/69 | HR 50 | Ht 62.0 in | Wt 178.0 lb

## 2018-04-10 DIAGNOSIS — Z5189 Encounter for other specified aftercare: Secondary | ICD-10-CM

## 2018-04-10 NOTE — Progress Notes (Signed)
Patient came into office today reporting a stitch that hasn't dissolved all the way on left incision. Both incisions are clean, intact and well healing. Small piece of suture remains on left side. Suture clipped & patient tolerated well.

## 2018-04-30 ENCOUNTER — Other Ambulatory Visit (HOSPITAL_COMMUNITY)
Admission: RE | Admit: 2018-04-30 | Discharge: 2018-04-30 | Disposition: A | Discharge status: Home / Self Care | Payer: Medicaid Other | Source: Ambulatory Visit | Attending: Obstetrics & Gynecology | Admitting: Obstetrics & Gynecology

## 2018-04-30 ENCOUNTER — Encounter: Payer: Self-pay | Admitting: Obstetrics & Gynecology

## 2018-04-30 ENCOUNTER — Ambulatory Visit (INDEPENDENT_AMBULATORY_CARE_PROVIDER_SITE_OTHER): Payer: Medicaid Other | Admitting: Obstetrics & Gynecology

## 2018-04-30 VITALS — BP 114/64 | HR 59 | Wt 180.9 lb

## 2018-04-30 DIAGNOSIS — Z202 Contact with and (suspected) exposure to infections with a predominantly sexual mode of transmission: Secondary | ICD-10-CM | POA: Diagnosis present

## 2018-04-30 DIAGNOSIS — Z7251 High risk heterosexual behavior: Secondary | ICD-10-CM

## 2018-04-30 NOTE — Addendum Note (Signed)
Addended by: Faythe Casa on: 04/30/2018 05:01 PM   Modules accepted: Orders

## 2018-04-30 NOTE — Progress Notes (Signed)
   Subjective:    Patient ID: Jane Finley, female    DOB: [birth year], 28 y.o.   MRN: [id number]  HPI 28 yo single P17 (56 yo daughter) here today for STI testing. She had unprotected sex a couple of days ago. She generally uses condoms.    Review of Systems     Objective:   Physical Exam Breathing, conversing, and ambulating normal Abd- benign     Assessment & Plan:  Testing for GC and CT Blood STI testing in 6 weeks at the health dept

## 2018-05-02 LAB — GC/CHLAMYDIA PROBE AMP (~~LOC~~) NOT AT ARMC
CHLAMYDIA, DNA PROBE: NEGATIVE
Neisseria Gonorrhea: NEGATIVE

## 2018-08-12 ENCOUNTER — Other Ambulatory Visit: Payer: Self-pay

## 2018-08-12 ENCOUNTER — Emergency Department (HOSPITAL_COMMUNITY)
Admission: EM | Admit: 2018-08-12 | Discharge: 2018-08-12 | Disposition: A | Discharge status: Home / Self Care | Payer: Medicaid Other | Attending: Emergency Medicine | Admitting: Emergency Medicine

## 2018-08-12 DIAGNOSIS — M545 Low back pain, unspecified: Secondary | ICD-10-CM

## 2018-08-12 DIAGNOSIS — B9689 Other specified bacterial agents as the cause of diseases classified elsewhere: Secondary | ICD-10-CM

## 2018-08-12 DIAGNOSIS — F17210 Nicotine dependence, cigarettes, uncomplicated: Secondary | ICD-10-CM | POA: Insufficient documentation

## 2018-08-12 DIAGNOSIS — N760 Acute vaginitis: Secondary | ICD-10-CM | POA: Insufficient documentation

## 2018-08-12 LAB — I-STAT BETA HCG BLOOD, ED (MC, WL, AP ONLY): I-stat hCG, quantitative: <5 m[IU]/mL (ref <5)

## 2018-08-12 LAB — COMPREHENSIVE METABOLIC PANEL
ALK PHOS: 69 U/L (ref 38–126)
ALT: 20 U/L (ref 0–44)
AST: 23 U/L (ref 15–41)
Albumin: 3.8 g/dL (ref 3.5–5.0)
Anion gap: 8 (ref 5–15)
BILIRUBIN TOTAL: 0.9 mg/dL (ref 0.3–1.2)
BUN: 8 mg/dL (ref 6–20)
CALCIUM: 9.4 mg/dL (ref 8.9–10.3)
CO2: 26 mmol/L (ref 22–32)
CREATININE: 0.84 mg/dL (ref 0.44–1.00)
Chloride: 106 mmol/L (ref 98–111)
GFR calc Af Amer: >60 mL/min (ref >60)
Glucose, Bld: 89 mg/dL (ref 70–99)
Potassium: 3.8 mmol/L (ref 3.5–5.1)
Sodium: 140 mmol/L (ref 135–145)
TOTAL PROTEIN: 6.6 g/dL (ref 6.5–8.1)

## 2018-08-12 LAB — WET PREP, GENITAL
SPERM: NONE SEEN
Trich, Wet Prep: NONE SEEN
YEAST WET PREP: NONE SEEN

## 2018-08-12 LAB — CBC
HCT: 40.9 % (ref 36.0–46.0)
Hemoglobin: 12.8 g/dL (ref 12.0–15.0)
MCH: 26.9 pg (ref 26.0–34.0)
MCHC: 31.3 g/dL (ref 30.0–36.0)
MCV: 86.1 fL (ref 78.0–100.0)
PLATELETS: 303 10*3/uL (ref 150–400)
RBC: 4.75 MIL/uL (ref 3.87–5.11)
RDW: 14.8 % (ref 11.5–15.5)
WBC: 4.9 10*3/uL (ref 4.0–10.5)

## 2018-08-12 LAB — URINALYSIS, ROUTINE W REFLEX MICROSCOPIC
Bilirubin Urine: NEGATIVE
GLUCOSE, UA: NEGATIVE mg/dL
Hgb urine dipstick: NEGATIVE
KETONES UR: NEGATIVE mg/dL
LEUKOCYTES UA: NEGATIVE
NITRITE: NEGATIVE
PROTEIN: NEGATIVE mg/dL
Specific Gravity, Urine: 1.029 (ref 1.005–1.030)
pH: 6 (ref 5.0–8.0)

## 2018-08-12 LAB — LIPASE, BLOOD: Lipase: 34 U/L (ref 11–51)

## 2018-08-12 MED ORDER — AZITHROMYCIN 250 MG PO TABS
1000.0000 mg | ORAL_TABLET | Freq: Once | ORAL | Status: AC
  Administered 2018-08-12: 1000 mg via ORAL
  Filled 2018-08-12: qty 4

## 2018-08-12 MED ORDER — GI COCKTAIL ~~LOC~~
30.0000 mL | Freq: Once | ORAL | Status: AC
  Administered 2018-08-12: 30 mL via ORAL
  Filled 2018-08-12: qty 30

## 2018-08-12 MED ORDER — ONDANSETRON 4 MG PO TBDP
4.0000 mg | ORAL_TABLET | Freq: Once | ORAL | Status: AC
  Administered 2018-08-12: 4 mg via ORAL
  Filled 2018-08-12: qty 1

## 2018-08-12 MED ORDER — LIDOCAINE HCL (PF) 1 % IJ SOLN
INTRAMUSCULAR | Status: AC
  Filled 2018-08-12: qty 5

## 2018-08-12 MED ORDER — METRONIDAZOLE 500 MG PO TABS: 500.0000 mg | ORAL_TABLET | Freq: Two times a day (BID) | ORAL | 0 refills | Status: DC

## 2018-08-12 MED ORDER — CEFTRIAXONE SODIUM 250 MG IJ SOLR
250.0000 mg | Freq: Once | INTRAMUSCULAR | Status: AC
  Administered 2018-08-12: 250 mg via INTRAMUSCULAR
  Filled 2018-08-12: qty 250

## 2018-08-12 NOTE — Discharge Instructions (Addendum)
Please take Ibuprofen (Advil, motrin) and Tylenol (acetaminophen) to relieve your pain.  You may take up to 600 MG (3 pills) of normal strength ibuprofen every 8 hours as needed.  In between doses of ibuprofen you make take tylenol, up to 1,000 mg (two extra strength pills).  Do not take more than 3,000 mg tylenol in a 24 hour period.  Please check all medication labels as many medications such as pain and cold medications may contain tylenol.  Do not drink alcohol while taking these medications.  Do not take other NSAID'S while taking ibuprofen (such as aleve or naproxen).  Please take ibuprofen with food to decrease stomach upset. ° °You may have diarrhea from the antibiotics.  It is very important that you continue to take the antibiotics even if you get diarrhea unless a medical professional tells you that you may stop taking them.  If you stop too early the bacteria you are being treated for will become stronger and you may need different, more powerful antibiotics that have more side effects and worsening diarrhea.  Please stay well hydrated and consider probiotics as they may decrease the severity of your diarrhea.  Please be aware that if you take any hormonal contraception (birth control pills, nexplanon, the ring, etc) that your birth control will not work while you are taking antibiotics and you need to use back up protection as directed on the birth control medication information insert.  ° °Today your diagnosed with bacterial vaginosis and received a prescription for metronidazole also known as Flagyl. It is very important that you do not consume any alcohol while taking this medication as it will cause you to become violently ill. ° °Today you have been treated for gonorrhea and chlamydia.  The test to determine if you have these will take a few days. They will only call you if your tests come back positive, no news is good news. In the result that your tests are positive you have already been  treated. ° °

## 2018-08-12 NOTE — ED Triage Notes (Signed)
Pt reports mod amt of white and green vaginal discharge onset x 1.5 wk, pt reports bil lower back pain, pt c/o n/v onset yesterday x 6 with diarrhea onset today with x2 loose stools today, A&O x4

## 2018-08-12 NOTE — ED Provider Notes (Signed)
[facility] Provider Note   CSN: [id number] Arrival date & time: 08/12/18  0753     History   Chief Complaint Chief Complaint  Patient presents with  . Vaginal Discharge    HPI Royetta CarBryana Kloehn is a 28 y.o. female history of bilateral salpingectomy for ectopics, Bipolar 1.  Presents today with a few week history of bilateral lower back pain.  She reports vaginal discharge for one week.  Along with nausea and vomiting since yesterday with diarrhea today.  Denies abdominal pain. Subjective fevers.   Her vaginal discharge started as white and thick and is now green, varies between clumpy and runny.  Reports itching and that her vulva feels raw.  Denies abnormal vaginal bleeding.  She is sexually active, reports with one partner but that partner has multiple other partners.  Occasional condom use.    Vomited 6x yesterday with loose stools x2 today.    HPI  Past Medical History:  Diagnosis Date  . Anxiety   . Bipolar 1 disorder (HCC)   . Depression     Patient Active Problem List   Diagnosis Date Noted  . Postoperative state 03/25/2018  . Ectopic pregnancy, tubal 03/17/2018  . PTSD (post-traumatic stress disorder) 11/02/2013  . Depression with suicidal ideation 10/31/2013  . Polysubstance abuse (HCC) 10/31/2013  . Homeless 10/31/2013    Past Surgical History:  Procedure Laterality Date  . LAPAROSCOPIC UNILATERAL SALPINGECTOMY Right 03/16/2018   Procedure: LAPAROSCOPIC RIGHT SALPINGECTOMY FOR MANAGEMENT OF ECTOPIC PREGNANCY;  Surgeon: Lazaro ArmsEure, Luther H, MD;  Location: [facility];  Service: Gynecology;  Laterality: Right;  . LAPAROSCOPY N/A 03/16/2018   Procedure: LAPAROSCOPY OPERATIVE;  Surgeon: Lazaro ArmsEure, Luther H, MD;  Location: [facility];  Service: Gynecology;  Laterality: N/A;  . LAPAROSCOPY N/A 03/25/2018   Procedure: LAPAROSCOPY DIAGNOSTIC;  Surgeon: Adam PhenixArnold, James G, MD;  Location: [facility];  Service: Gynecology;  Laterality: N/A;  . UNILATERAL  SALPINGECTOMY Left 03/25/2018   Procedure: UNILATERAL SALPINGECTOMY;  Surgeon: Adam PhenixArnold, James G, MD;  Location: [facility];  Service: Gynecology;  Laterality: Left;     OB History    Gravida  3   Para  1   Term  1   Preterm      AB  1   Living  1     SAB  1   TAB      Ectopic      Multiple      Live Births  1            Home Medications    Prior to Admission medications   Medication Sig Start Date End Date Taking? Authorizing Provider  metroNIDAZOLE (FLAGYL) 500 MG tablet Take 1 tablet (500 mg total) by mouth 2 (two) times daily. 08/12/18   Cristina GongHammond, Jaelah Hauth W, PA-C    Family History No family history on file.  Social History Social History   Tobacco Use  . Smoking status: Current Every Day Smoker    Packs/day: 0.25    Years: 11.00    Pack years: 2.75    Types: Cigarettes  . Smokeless tobacco: Never Used  Substance Use Topics  . Alcohol use: Yes  . Drug use: Yes    Types: Marijuana     Allergies   Patient has no known allergies.   Review of Systems Review of Systems  Gastrointestinal: Positive for abdominal pain, diarrhea, nausea and vomiting.  Genitourinary: Positive for pelvic pain and vaginal discharge. Negative for difficulty urinating, dysuria, menstrual problem,  vaginal bleeding and vaginal pain.  Musculoskeletal: Positive for back pain.  All other systems reviewed and are negative.    Physical Exam Updated Vital Signs BP 125/75 (BP Location: Right Arm)   Pulse 62   Temp 97.7 F (36.5 C) (Oral)   Resp 18   LMP 07/27/2018 (Approximate)   SpO2 100%   Breastfeeding? No   Physical Exam  Constitutional: She appears well-developed and well-nourished. No distress.  HENT:  Head: Normocephalic and atraumatic.  Eyes: Conjunctivae are normal. Right eye exhibits no discharge. Left eye exhibits no discharge. No scleral icterus.  Neck: Normal range of motion.  Cardiovascular: Normal rate, regular rhythm and intact distal pulses.    Pulmonary/Chest: Effort normal. No stridor. No respiratory distress.  Abdominal: Soft. Bowel sounds are normal. She exhibits no distension. There is no tenderness. There is no guarding.  Genitourinary:  Genitourinary Comments: Exam performed with chaperone in room.  There is a moderate amount of white discharge in the vaginal canal.  No cervical motion tenderness.  No adnexal tenderness or fullness.  Musculoskeletal: She exhibits no edema or deformity.  Neurological: She is alert. She exhibits normal muscle tone.  Skin: Skin is warm and dry. She is not diaphoretic.  Psychiatric: She has a normal mood and affect. Her behavior is normal.  Nursing note and vitals reviewed.    ED Treatments / Results  Labs (all labs ordered are listed, but only abnormal results are displayed) Labs Reviewed  WET PREP, GENITAL - Abnormal; Notable for the following components:      Result Value   Clue Cells Wet Prep HPF POC PRESENT (*)    WBC, Wet Prep HPF POC MANY (*)    All other components within normal limits  LIPASE, BLOOD  COMPREHENSIVE METABOLIC PANEL  CBC  URINALYSIS, ROUTINE W REFLEX MICROSCOPIC  RPR  HIV ANTIBODY (ROUTINE TESTING)  I-STAT BETA HCG BLOOD, ED ([facility], [facility], [facility] ONLY)  GC/CHLAMYDIA PROBE AMP ([place]) NOT AT [facility]    EKG None  Radiology No results found.  Procedures Procedures (including critical care time)  Medications Ordered in ED Medications  gi cocktail (Maalox,Lidocaine,Donnatal) (30 mLs Oral Given 08/12/18 1213)  ondansetron (ZOFRAN-ODT) disintegrating tablet 4 mg (4 mg Oral Given 08/12/18 1322)  cefTRIAXone (ROCEPHIN) injection 250 mg (250 mg Intramuscular Given 08/12/18 1322)  azithromycin (ZITHROMAX) tablet 1,000 mg (1,000 mg Oral Given 08/12/18 1322)     Initial Impression / Assessment and Plan / ED Course  I have reviewed the triage vital signs and the nursing notes.  Pertinent labs & imaging results that were available during my care of the patient were  reviewed by me and considered in my medical decision making (see chart for details).  Clinical Course as of Aug 13 1742  Tue Aug 12, 2018  1311 Reevaluated, she had not been given p.o. challenge.  Provided her with food and drink for p.o. challenge.  Discussed results and she wishes for gonorrhea chlamydia treatment.   [EH]  1353 Patient reevaluated, passed p.o. challenge, she is requesting a discharge and return to work today note, stating that she cannot afford to miss work.   [EH]    Clinical Course User Index [EH] Cristina GongHammond, Rufina Kimery W, PA-C   Patient presents today for evaluation of multiple complaints. 1.  Back pain Patient reports bilateral lumbar back pain.  On exam she has lateral paraspinal tenderness to palpation, palpation over this area both re-creates and exacerbates her pain.  No midline spinal tenderness to palpation.  She does have some chronic back pain at baseline , #2 may be playing a role in her back pain, conservative care. 2.  Nausea vomiting diarrhea. Patient with symptoms consistent with viral gastroenteritis.  Vitals are stable, no fever.  No signs of dehydration, tolerating PO fluids > 6 oz.  Lungs are clear.  No focal abdominal pain, no concern for appendicitis, cholecystitis, pancreatitis, ruptured viscus, UTI, kidney stone, or any other abdominal etiology.  Based on reported vaginal drainage/discharge, see #3, labs were obtained and reviewed, she does not have an elevated white count, normal lipase, she does not have any significant electrolyte abnormalities.  She was observed in the ER for multiple hours without requiring nausea medicine and did not have any vomiting or diarrhea.  She did have reported left upper quadrant pain which she says is uncomfortable, given GI cocktail and that fully resolved.  3.  Vaginal drainage. Pregnancy test negative, patient has had unprotected sex recently is concern for STD.  Pelvic exam performed with vaginal drainage without  cervical motion tenderness, adnexal pain or fullness.  Wet prep with many clue cells and many white blood cells.  Gonorrhea chlamydia testing sent along with HIV and syphilis testing per patient's request.  Per her request she was treated empirically with Rocephin and azithromycin for possible gonorrhea chlamydia, I did give her 1 dose of Zofran during this to help prevent post antibiotic emesis.  Return precautions were discussed with patient who states their understanding.  At the time of discharge patient denied any unaddressed complaints or concerns.  Patient is agreeable for discharge home.   Final Clinical Impressions(s) / ED Diagnoses   Final diagnoses:  BV (bacterial vaginosis)  Acute bilateral low back pain without sciatica    ED Discharge Orders         Ordered    metroNIDAZOLE (FLAGYL) 500 MG tablet  2 times daily     08/12/18 1356           Cristina Gong, [place] 08/13/18 1743    Eber Hong, MD 08/14/18 1016

## 2018-08-12 NOTE — ED Notes (Signed)
Walked patient to the bathroom patient did well now back in bed with call bell in reach

## 2018-08-13 LAB — GC/CHLAMYDIA PROBE AMP (~~LOC~~) NOT AT ARMC
CHLAMYDIA, DNA PROBE: NEGATIVE
NEISSERIA GONORRHEA: NEGATIVE

## 2018-08-13 LAB — RPR: RPR Ser Ql: NONREACTIVE

## 2018-08-13 LAB — HIV ANTIBODY (ROUTINE TESTING W REFLEX): HIV Screen 4th Generation wRfx: NONREACTIVE

## 2019-03-08 ENCOUNTER — Emergency Department (HOSPITAL_COMMUNITY)
Admission: EM | Admit: 2019-03-08 | Discharge: 2019-03-08 | Disposition: A | Discharge status: Home / Self Care | Payer: Medicaid Other | Attending: Emergency Medicine | Admitting: Emergency Medicine

## 2019-03-08 ENCOUNTER — Other Ambulatory Visit: Payer: Self-pay

## 2019-03-08 ENCOUNTER — Encounter (HOSPITAL_COMMUNITY): Payer: Self-pay

## 2019-03-08 DIAGNOSIS — R21 Rash and other nonspecific skin eruption: Secondary | ICD-10-CM

## 2019-03-08 DIAGNOSIS — F17210 Nicotine dependence, cigarettes, uncomplicated: Secondary | ICD-10-CM | POA: Insufficient documentation

## 2019-03-08 DIAGNOSIS — R42 Dizziness and giddiness: Secondary | ICD-10-CM | POA: Insufficient documentation

## 2019-03-08 DIAGNOSIS — Z79899 Other long term (current) drug therapy: Secondary | ICD-10-CM | POA: Insufficient documentation

## 2019-03-08 LAB — COMPREHENSIVE METABOLIC PANEL
ALBUMIN: 4 g/dL (ref 3.5–5.0)
ALT: 16 U/L (ref 0–44)
ANION GAP: 9 (ref 5–15)
AST: 25 U/L (ref 15–41)
Alkaline Phosphatase: 69 U/L (ref 38–126)
BILIRUBIN TOTAL: 0.6 mg/dL (ref 0.3–1.2)
BUN: 9 mg/dL (ref 6–20)
CO2: 21 mmol/L — ABNORMAL LOW (ref 22–32)
Calcium: 9.3 mg/dL (ref 8.9–10.3)
Chloride: 106 mmol/L (ref 98–111)
Creatinine, Ser: 0.87 mg/dL (ref 0.44–1.00)
Glucose, Bld: 82 mg/dL (ref 70–99)
POTASSIUM: 3.7 mmol/L (ref 3.5–5.1)
Sodium: 136 mmol/L (ref 135–145)
TOTAL PROTEIN: 7 g/dL (ref 6.5–8.1)

## 2019-03-08 LAB — CBC WITH DIFFERENTIAL/PLATELET
ABS IMMATURE GRANULOCYTES: 0.02 10*3/uL (ref 0.00–0.07)
BASOS PCT: 1 %
Basophils Absolute: 0 10*3/uL (ref 0.0–0.1)
Eosinophils Absolute: 0.1 10*3/uL (ref 0.0–0.5)
Eosinophils Relative: 1 %
HCT: 40.6 % (ref 36.0–46.0)
Hemoglobin: 12.8 g/dL (ref 12.0–15.0)
IMMATURE GRANULOCYTES: 0 %
Lymphocytes Relative: 29 %
Lymphs Abs: 1.9 10*3/uL (ref 0.7–4.0)
MCH: 27.7 pg (ref 26.0–34.0)
MCHC: 31.5 g/dL (ref 30.0–36.0)
MCV: 87.9 fL (ref 80.0–100.0)
Monocytes Absolute: 0.5 10*3/uL (ref 0.1–1.0)
Monocytes Relative: 7 %
NEUTROS PCT: 62 %
NRBC: 0 % (ref 0.0–0.2)
Neutro Abs: 4.3 10*3/uL (ref 1.7–7.7)
PLATELETS: 269 10*3/uL (ref 150–400)
RBC: 4.62 MIL/uL (ref 3.87–5.11)
RDW: 14.7 % (ref 11.5–15.5)
WBC: 6.8 10*3/uL (ref 4.0–10.5)

## 2019-03-08 LAB — CBG MONITORING, ED: Glucose-Capillary: 85 mg/dL (ref 70–99)

## 2019-03-08 LAB — I-STAT BETA HCG BLOOD, ED (MC, WL, AP ONLY): I-stat hCG, quantitative: <5 m[IU]/mL (ref <5)

## 2019-03-08 MED ORDER — MECLIZINE HCL 25 MG PO TABS: 25.0000 mg | ORAL_TABLET | Freq: Three times a day (TID) | ORAL | 0 refills | Status: DC | PRN

## 2019-03-08 MED ORDER — MECLIZINE HCL 25 MG PO TABS
25.0000 mg | ORAL_TABLET | Freq: Once | ORAL | Status: AC
  Administered 2019-03-08: 25 mg via ORAL
  Filled 2019-03-08: qty 1

## 2019-03-08 MED ORDER — SODIUM CHLORIDE 0.9 % IV BOLUS
1000.0000 mL | Freq: Once | INTRAVENOUS | Status: AC
  Administered 2019-03-08: 1000 mL via INTRAVENOUS

## 2019-03-08 MED ORDER — PREDNISONE 10 MG (21) PO TBPK: ORAL_TABLET | Freq: Every day | ORAL | 0 refills | Status: DC

## 2019-03-08 MED ORDER — METHYLPREDNISOLONE SODIUM SUCC 125 MG IJ SOLR
125.0000 mg | Freq: Once | INTRAMUSCULAR | Status: AC
  Administered 2019-03-08: 125 mg via INTRAVENOUS
  Filled 2019-03-08: qty 2

## 2019-03-08 NOTE — ED Notes (Signed)
Patient verbalizes understanding of discharge instructions. Opportunity for questioning and answers were provided. Armband removed by staff, pt discharged from ED ambulatory.   

## 2019-03-08 NOTE — ED Notes (Signed)
Patient ambulated around nursing station. She spoke with Hina PA in hallway.

## 2019-03-08 NOTE — Discharge Instructions (Signed)
Take the entire course of prednisone beginning tomorrow to help with your rash. Take the meclizine as needed for dizziness. Return to the ED for worsening symptoms, fever, chest pain, shortness of breath, head injuries or falls.

## 2019-03-08 NOTE — ED Triage Notes (Signed)
Pt arrives with complaints of skin leisons which occasionally itch, and intermittent episodes of near syncope which she describes as "my body feels light, everything goes white, and I feel myself falling back." intermittent chills, no known fevers. No cough, does have nausea

## 2019-03-08 NOTE — ED Provider Notes (Signed)
[facility] EMERGENCY DEPARTMENT Provider Note   CSN: [id number] Arrival date & time: 03/08/19  1655    History   Chief Complaint Chief Complaint  Patient presents with  . Dizziness  . Rash    HPI Jane Finley is a 29 y.o. female with a past medical history of anxiety, bipolar disorder, depression, daily alcohol use, tobacco use, cocaine and ecstasy use presents to ED for multiple complaints. Her first complaint is rash to L forearm, L upper abdomen and suprapubic area for the past 2 days. She cannot recall any trigger as she denies any new soaps, lotions, detergents, food ingestions that may have caused it.  She works as a Financial risk analyst and was not exposed to any new foods.  She has no known allergies.  She denies history of similar symptoms in the past. The area is intermittently itchy but she has had no improvement with antihistamines.  States that the area in her forearm has spread since the symptoms began.  She denies any concern for angioedema or anaphylaxis. Her next complaint is intermittent dizziness, feeling like she is going to pass out since last night.  States that "everything goes white and I feel like going in a pass out."  This has been intermittent, with no specific aggravating or alleviating factor.  She denies any head injury or loss of consciousness.  She reports associated nausea but denies any vomiting, fever, headache, decreased p.o. intake. She denies any IV drug use.     HPI  Past Medical History:  Diagnosis Date  . Anxiety   . Bipolar 1 disorder (HCC)   . Depression     Patient Active Problem List   Diagnosis Date Noted  . Postoperative state 03/25/2018  . Ectopic pregnancy, tubal 03/17/2018  . PTSD (post-traumatic stress disorder) 11/02/2013  . Depression with suicidal ideation 10/31/2013  . Polysubstance abuse (HCC) 10/31/2013  . Homeless 10/31/2013    Past Surgical History:  Procedure Laterality Date  . LAPAROSCOPIC UNILATERAL  SALPINGECTOMY Right 03/16/2018   Procedure: LAPAROSCOPIC RIGHT SALPINGECTOMY FOR MANAGEMENT OF ECTOPIC PREGNANCY;  Surgeon: Lazaro Arms, MD;  Location: [facility];  Service: Gynecology;  Laterality: Right;  . LAPAROSCOPY N/A 03/16/2018   Procedure: LAPAROSCOPY OPERATIVE;  Surgeon: Lazaro Arms, MD;  Location: [facility];  Service: Gynecology;  Laterality: N/A;  . LAPAROSCOPY N/A 03/25/2018   Procedure: LAPAROSCOPY DIAGNOSTIC;  Surgeon: Adam Phenix, MD;  Location: [facility];  Service: Gynecology;  Laterality: N/A;  . UNILATERAL SALPINGECTOMY Left 03/25/2018   Procedure: UNILATERAL SALPINGECTOMY;  Surgeon: Adam Phenix, MD;  Location: [facility];  Service: Gynecology;  Laterality: Left;     OB History    Gravida  3   Para  1   Term  1   Preterm      AB  1   Living  1     SAB  1   TAB      Ectopic      Multiple      Live Births  1            Home Medications    Prior to Admission medications   Medication Sig Start Date End Date Taking? Authorizing Provider  meclizine (ANTIVERT) 25 MG tablet Take 1 tablet (25 mg total) by mouth 3 (three) times daily as needed for dizziness. 03/08/19   Caramia Boutin, PA-C  metroNIDAZOLE (FLAGYL) 500 MG tablet Take 1 tablet (500 mg total) by mouth 2 (two) times daily. 08/12/18  Lyndel Safe W, PA-C  predniSONE (STERAPRED UNI-PAK 21 TAB) 10 MG (21) TBPK tablet Take by mouth daily. Take 6 tabs by mouth daily  for 2 days, then 5 tabs for 2 days, then 4 tabs for 2 days, then 3 tabs for 2 days, 2 tabs for 2 days, then 1 tab by mouth daily for 2 days 03/08/19   Dietrich Pates, PA-C    Family History History reviewed. No pertinent family history.  Social History Social History   Tobacco Use  . Smoking status: Current Every Day Smoker    Packs/day: 0.25    Years: 11.00    Pack years: 2.75    Types: Cigarettes  . Smokeless tobacco: Never Used  Substance Use Topics  . Alcohol use: Yes  . Drug use: Yes    Types: Marijuana     Allergies     Patient has no known allergies.   Review of Systems Review of Systems  Constitutional: Negative for appetite change, chills and fever.  HENT: Negative for ear pain, rhinorrhea, sneezing and sore throat.   Eyes: Negative for photophobia and visual disturbance.  Respiratory: Negative for cough, chest tightness, shortness of breath and wheezing.   Cardiovascular: Negative for chest pain and palpitations.  Gastrointestinal: Positive for nausea. Negative for abdominal pain, blood in stool, constipation, diarrhea and vomiting.  Genitourinary: Negative for dysuria, hematuria and urgency.  Musculoskeletal: Negative for myalgias.  Skin: Positive for rash.  Neurological: Positive for dizziness and light-headedness. Negative for weakness.     Physical Exam Updated Vital Signs BP 129/82   Pulse 76   Temp 98.4 F (36.9 C) (Oral)   Resp 16   Ht 5\' 2"  (1.575 m)   Wt 79.4 kg   SpO2 100%   BMI 32.01 kg/m   Physical Exam Vitals signs and nursing note reviewed.  Constitutional:      General: She is not in acute distress.    Appearance: She is well-developed.     Comments: Normal work of breathing noted.  No signs of angioedema or anaphylaxis. NAD.  HENT:     Head: Normocephalic and atraumatic.     Nose: Nose normal.  Eyes:     General: No scleral icterus.       Right eye: No discharge.        Left eye: No discharge.     Conjunctiva/sclera: Conjunctivae normal.     Pupils: Pupils are equal, round, and reactive to light.  Neck:     Musculoskeletal: Normal range of motion and neck supple.  Cardiovascular:     Rate and Rhythm: Normal rate and regular rhythm.     Heart sounds: Normal heart sounds. No murmur. No friction rub. No gallop.   Pulmonary:     Effort: Pulmonary effort is normal. No respiratory distress.     Breath sounds: Normal breath sounds.  Abdominal:     General: Bowel sounds are normal. There is no distension.     Palpations: Abdomen is soft.     Tenderness: There is  no abdominal tenderness. There is no guarding.  Musculoskeletal: Normal range of motion.  Skin:    General: Skin is warm and dry.     Findings: Erythema and rash present.     Comments: Areas of erythema noted on left forearm, left upper quadrant and suprapubic area. No pustules, papules, vesicles seen.  No track marks seen.  Neurological:     General: No focal deficit present.     Mental Status: She  is alert and oriented to person, place, and time.     Cranial Nerves: No cranial nerve deficit.     Sensory: No sensory deficit.     Motor: No weakness or abnormal muscle tone.     Coordination: Coordination normal.     Comments: Pupils reactive. No facial asymmetry noted. Cranial nerves appear grossly intact. Sensation intact to light touch on face, BUE and BLE. Strength 5/5 in BUE and BLE.        ED Treatments / Results  Labs (all labs ordered are listed, but only abnormal results are displayed) Labs Reviewed  COMPREHENSIVE METABOLIC PANEL - Abnormal; Notable for the following components:      Result Value   CO2 21 (*)    All other components within normal limits  CBC WITH DIFFERENTIAL/PLATELET  CBG MONITORING, ED  I-STAT BETA HCG BLOOD, ED ([facility], [facility], [facility] ONLY)    EKG None  Radiology No results found.  Procedures Procedures (including critical care time)  Medications Ordered in ED Medications  sodium chloride 0.9 % bolus 1,000 mL (1,000 mLs Intravenous New Bag/Given 03/08/19 1738)  meclizine (ANTIVERT) tablet 25 mg (25 mg Oral Given 03/08/19 1800)  methylPREDNISolone sodium succinate (SOLU-MEDROL) 125 mg/2 mL injection 125 mg (125 mg Intravenous Given 03/08/19 1800)     Initial Impression / Assessment and Plan / ED Course  I have reviewed the triage vital signs and the nursing notes.  Pertinent labs & imaging results that were available during my care of the patient were reviewed by me and considered in my medical decision making (see chart for details).  Clinical Course as  of Mar 07 1946  Wynelle Link Mar 08, 2019  1813 Orthostatic Vital Signs Orthostatic Lying  BP- Lying:103/57 Pulse- Lying:54 Orthostatic Sitting BP- Sitting:97/62 Pulse- Sitting:52 Orthostatic Standing at 0 minutes BP- Standing at 0 minutes:108/64 Pulse- Standing at 0 minutes:53   [HK]  1853 Patient with improvement in her symptoms with medications given.  Will ambulate and reassess.   [HK]    Clinical Course User Index [HK] Dietrich Pates, PA-C       29 year old female with past medical history of alcohol abuse, tobacco abuse, drug use, anxiety, bipolar disorder presents to ED for multiple complaints.  She complains of pruritic rash to her left forearm, left upper abdomen and suprapubic area for the past 2 days.  No improvement with antihistamines.  No new triggers.  Erythema noted in these areas. Pt has a patent airway without stridor and is handling secretions without difficulty; no angioedema. No blisters, no pustules, no warmth, no draining sinus tracts, no superficial abscesses, no bullous impetigo, no vesicles, no desquamation, no target lesions with dusky purpura or a central bulla. Not tender to touch. No concern for superimposed infection. No concern for SJS, TEN, TSS, tick borne illness, syphilis or other life-threatening condition.  Improvement in itching and irritation with Solu-Medrol.  Will treat as allergic reaction/contact dermatitis.  It would be unusual for her to have cellulitis in multiple parts of her body. She also complains of intermittent dizziness.  Lab work is unremarkable.  Orthostatic vital signs noted above.  No deficits neurological exam noted.  She was given fluids and meclizine with significant improvement in her symptoms.  She rested, ambulated here without recurrence of symptoms.  We will have her take meclizine for what appears to be vertigo. There are no characteristics that are lateralizing or concerning for increased ICP, infectious or vascular cause of her  symptoms.    Have  her follow-up with PCP and return to ED for any severe worsening symptoms.  Patient is hemodynamically stable, in NAD, and able to ambulate in the ED. Evaluation does not show pathology that would require ongoing emergent intervention or inpatient treatment. I explained the diagnosis to the patient. Pain has been managed and has no complaints prior to discharge. Patient is comfortable with above plan and is stable for discharge at this time. All questions were answered prior to disposition. Strict return precautions for returning to the ED were discussed. Encouraged follow up with PCP.    Portions of this note were generated with Scientist, clinical (histocompatibility and immunogenetics)Dragon dictation software. Dictation errors may occur despite best attempts at proofreading.    Final Clinical Impressions(s) / ED Diagnoses   Final diagnoses:  Vertigo  Rash and nonspecific skin eruption    ED Discharge Orders         Ordered    meclizine (ANTIVERT) 25 MG tablet  3 times daily PRN     03/08/19 1944    predniSONE (STERAPRED UNI-PAK 21 TAB) 10 MG (21) TBPK tablet  Daily     03/08/19 1944           Dietrich PatesKhatri, Rogan Ecklund, PA-C 03/08/19 1947    Melene PlanFloyd, Dan, DO 03/08/19 2241

## 2019-05-27 ENCOUNTER — Encounter (HOSPITAL_COMMUNITY): Payer: Self-pay | Admitting: Emergency Medicine

## 2019-05-27 ENCOUNTER — Ambulatory Visit (HOSPITAL_COMMUNITY)
Admission: EM | Admit: 2019-05-27 | Discharge: 2019-05-27 | Disposition: A | Discharge status: Home / Self Care | Payer: Medicaid Other | Attending: Internal Medicine | Admitting: Internal Medicine

## 2019-05-27 ENCOUNTER — Telehealth: Payer: Medicaid Other

## 2019-05-27 ENCOUNTER — Other Ambulatory Visit: Payer: Self-pay

## 2019-05-27 ENCOUNTER — Ambulatory Visit
Admission: RE | Admit: 2019-05-27 | Discharge: 2019-05-27 | Discharge status: Absent without leave | Payer: Medicaid Other | Source: Ambulatory Visit

## 2019-05-27 DIAGNOSIS — Z113 Encounter for screening for infections with a predominantly sexual mode of transmission: Secondary | ICD-10-CM

## 2019-05-27 DIAGNOSIS — L509 Urticaria, unspecified: Secondary | ICD-10-CM

## 2019-05-27 MED ORDER — CETIRIZINE HCL 10 MG PO TABS: 10.0000 mg | ORAL_TABLET | Freq: Every day | ORAL | 0 refills | Status: DC

## 2019-05-27 MED ORDER — HYDROCORTISONE 2.5 % EX LOTN: TOPICAL_LOTION | Freq: Two times a day (BID) | CUTANEOUS | 0 refills | Status: AC

## 2019-05-27 NOTE — Discharge Instructions (Signed)
Zyrtec daily.  May apply hydrocortisone cream to area twice a day.  Will notify you of any positive findings from your vaginal swab and if any changes to treatment are needed.   You may monitor your results on your MyChart online as well.   If symptoms worsen or do not improve in the next week to return to be seen or to follow up with your PCP.

## 2019-05-27 NOTE — ED Provider Notes (Signed)
[facility]    CSN: [id number] Arrival date & time: 05/27/19  1557     History   Chief Complaint Chief Complaint  Patient presents with  . SEXUALLY TRANSMITTED DISEASE    HPI Jane Finley is a 28 y.o. female.   Jane Finley presents with complaints of red area to pubic mons which is itchy and uncomfortable. Noted it yesterday. Had unprotected sex the night prior with her female partner. Has had similar in the past to other areas of the body, was told it was allergic response. States has occasional vaginal discharge, but no bleeding or vaginal itching. No abscess presence or drainage. Hasn't tried any medications for her symptoms. No other affected areas. No new products or known allergens. Her partner states he had been wearing new underwear which hadn't yet been washed but otherwise he has not had a chance to wash them. No other new products or known allergens that he was exposed to. No pustule or drainage. States she would like std screening as well, has occasional intermittent variable vaginal discharge. No vaginal itching or pain. No urinary symptoms. Hx of anxiety, depression, bipolar.     ROS per HPI, negative if not otherwise mentioned.      Past Medical History:  Diagnosis Date  . Anxiety   . Bipolar 1 disorder (HCC)   . Depression     Patient Active Problem List   Diagnosis Date Noted  . Postoperative state 03/25/2018  . Ectopic pregnancy, tubal 03/17/2018  . PTSD (post-traumatic stress disorder) 11/02/2013  . Depression with suicidal ideation 10/31/2013  . Polysubstance abuse (HCC) 10/31/2013  . Homeless 10/31/2013    Past Surgical History:  Procedure Laterality Date  . LAPAROSCOPIC UNILATERAL SALPINGECTOMY Right 03/16/2018   Procedure: LAPAROSCOPIC RIGHT SALPINGECTOMY FOR MANAGEMENT OF ECTOPIC PREGNANCY;  Surgeon: Lazaro Arms, MD;  Location: [facility];  Service: Gynecology;  Laterality: Right;  . LAPAROSCOPY N/A 03/16/2018   Procedure:  LAPAROSCOPY OPERATIVE;  Surgeon: Lazaro Arms, MD;  Location: [facility];  Service: Gynecology;  Laterality: N/A;  . LAPAROSCOPY N/A 03/25/2018   Procedure: LAPAROSCOPY DIAGNOSTIC;  Surgeon: Adam Phenix, MD;  Location: [facility];  Service: Gynecology;  Laterality: N/A;  . UNILATERAL SALPINGECTOMY Left 03/25/2018   Procedure: UNILATERAL SALPINGECTOMY;  Surgeon: Adam Phenix, MD;  Location: [facility];  Service: Gynecology;  Laterality: Left;    OB History    Gravida  3   Para  1   Term  1   Preterm      AB  1   Living  1     SAB  1   TAB      Ectopic      Multiple      Live Births  1            Home Medications    Prior to Admission medications   Medication Sig Start Date End Date Taking? Authorizing Provider  cetirizine (ZYRTEC) 10 MG tablet Take 1 tablet (10 mg total) by mouth daily. 05/27/19   Linus Mako B, NP  hydrocortisone 2.5 % lotion Apply topically 2 (two) times daily. 05/27/19   Georgetta Haber, NP    Family History History reviewed. No pertinent family history.  Social History Social History   Tobacco Use  . Smoking status: Current Some Day Smoker    Packs/day: 0.25    Years: 11.00    Pack years: 2.75    Types: Cigarettes  . Smokeless tobacco: Never Used  Substance Use Topics  . Alcohol use: Yes  . Drug use: Yes    Types: Marijuana     Allergies   Patient has no known allergies.   Review of Systems Review of Systems   Physical Exam Triage Vital Signs ED Triage Vitals  Enc Vitals Group     BP 05/27/19 1623 119/74     Pulse Rate 05/27/19 1623 61     Resp 05/27/19 1623 16     Temp 05/27/19 1623 98.4 F (36.9 C)     Temp Source 05/27/19 1623 Oral     SpO2 05/27/19 1623 100 %     Weight --      Height --      Head Circumference --      Peak Flow --      Pain Score 05/27/19 1620 7     Pain Loc --      Pain Edu? --      Excl. in GC? --    No data found.  Updated Vital Signs BP 119/74 (BP Location: Left Arm) Comment:  large cuff  Pulse 61   Temp 98.4 F (36.9 C) (Oral)   Resp 16   LMP 05/22/2019   SpO2 100%   Visual Acuity Right Eye Distance:   Left Eye Distance:   Bilateral Distance:    Right Eye Near:   Left Eye Near:    Bilateral Near:     Physical Exam Constitutional:      General: She is not in acute distress.    Appearance: She is well-developed.  Cardiovascular:     Rate and Rhythm: Normal rate and regular rhythm.     Heart sounds: Normal heart sounds.  Pulmonary:     Effort: Pulmonary effort is normal.     Breath sounds: Normal breath sounds.  Abdominal:     Palpations: Abdomen is soft. Abdomen is not rigid.     Tenderness: There is no abdominal tenderness. There is no guarding or rebound.  Genitourinary:    Comments: Denies sores, lesions, vaginal bleeding; no pelvic pain; gu exam deferred at this time, vaginal self swab collected.   Skin:    General: Skin is warm and dry.          Comments: Circular red, slightly raised, no flaking or broken skin,  blanching hive to suprapubic/ mons pubis; no fluctuance or abscess, mild generalized pain but overall not specifically painful  Neurological:     Mental Status: She is alert and oriented to person, place, and time.      UC Treatments / Results  Labs (all labs ordered are listed, but only abnormal results are displayed) Labs Reviewed  CERVICOVAGINAL ANCILLARY ONLY    EKG None  Radiology No results found.  Procedures Procedures (including critical care time)  Medications Ordered in UC Medications - No data to display  Initial Impression / Assessment and Plan / UC Course  I have reviewed the triage vital signs and the nursing notes.  Pertinent labs & imaging results that were available during my care of the patient were reviewed by me and considered in my medical decision making (see chart for details).     Hive-like lesion to suprapubic region. No visible abscess. Itches patient. No skin breakdown. No vulvar  involvement. Vaginal self swab also collected. Antihistamine and topical hydrocortisone recommended. Return precautions provided. Patient verbalized understanding and agreeable to plan.   Final Clinical Impressions(s) / UC Diagnoses   Final diagnoses:  Hive  Screen  for STD (sexually transmitted disease)     Discharge Instructions     Zyrtec daily.  May apply hydrocortisone cream to area twice a day.  Will notify you of any positive findings from your vaginal swab and if any changes to treatment are needed.   You may monitor your results on your MyChart online as well.   If symptoms worsen or do not improve in the next week to return to be seen or to follow up with your PCP.     ED Prescriptions    Medication Sig Dispense Auth. Provider   cetirizine (ZYRTEC) 10 MG tablet Take 1 tablet (10 mg total) by mouth daily. 30 tablet Linus MakoBurky, Willadean Guyton B, NP   hydrocortisone 2.5 % lotion Apply topically 2 (two) times daily. 59 mL Linus MakoBurky, Emberleigh Reily B, NP     Controlled Substance Prescriptions [place] Controlled Substance Registry consulted? Not Applicable   Georgetta HaberBurky, Ommie Degeorge B, NP 05/27/19 [phone]

## 2019-05-27 NOTE — ED Triage Notes (Signed)
Red, tender, swollen, burning area to pelvis, left side to labia.    Patient also requesting std testing

## 2019-05-28 LAB — CERVICOVAGINAL ANCILLARY ONLY
Bacterial vaginitis: POSITIVE — AB
Candida vaginitis: NEGATIVE
Chlamydia: NEGATIVE
Neisseria Gonorrhea: NEGATIVE
Trichomonas: POSITIVE — AB

## 2019-05-29 ENCOUNTER — Encounter (HOSPITAL_COMMUNITY): Payer: Self-pay

## 2019-05-29 ENCOUNTER — Telehealth (HOSPITAL_COMMUNITY): Payer: Self-pay | Admitting: Emergency Medicine

## 2019-05-29 MED ORDER — METRONIDAZOLE 500 MG PO TABS: 500.0000 mg | ORAL_TABLET | Freq: Two times a day (BID) | ORAL | 0 refills | Status: AC

## 2019-05-29 NOTE — Telephone Encounter (Signed)
Bacterial vaginosis is positive. This was not treated at the urgent care visit.  Flagyl 500 mg BID x 7 days #14 no refills sent to patients pharmacy of choice.    Trichomonas is positive. Rx  for Flagyl was sent to the pharmacy of record. Pt needs education to refrain from sexual intercourse for 7 days to give the medicine time to work. Sexual partners need to be notified and tested/treated. Condoms may reduce risk of reinfection. Recheck for further evaluation if symptoms are not improving.   Attempted to reach patient. No answer at this time. No voicemail.  Marland Kitchen

## 2019-06-01 ENCOUNTER — Telehealth (HOSPITAL_COMMUNITY): Payer: Self-pay | Admitting: Emergency Medicine

## 2019-06-01 NOTE — Telephone Encounter (Signed)
Attempted to reach patient. No answer at this time, no voicemail available.

## 2019-06-08 ENCOUNTER — Telehealth (HOSPITAL_COMMUNITY): Payer: Self-pay | Admitting: Emergency Medicine

## 2019-06-08 NOTE — Telephone Encounter (Signed)
Patient contacted and made aware of all results, all questions answered.   

## 2019-06-08 NOTE — Telephone Encounter (Signed)
Attempted to reach patient. No answer at this time. No voicemail available. Letter sent.

## 2019-06-29 IMAGING — DX DG CHEST 2V
2 series · 2 of 2 positions shown · non-contrast
Comparison: None available.

CLINICAL DATA: Body aches and chills since yesterday.

EXAM:
CHEST  2 VIEW

[chest pa]
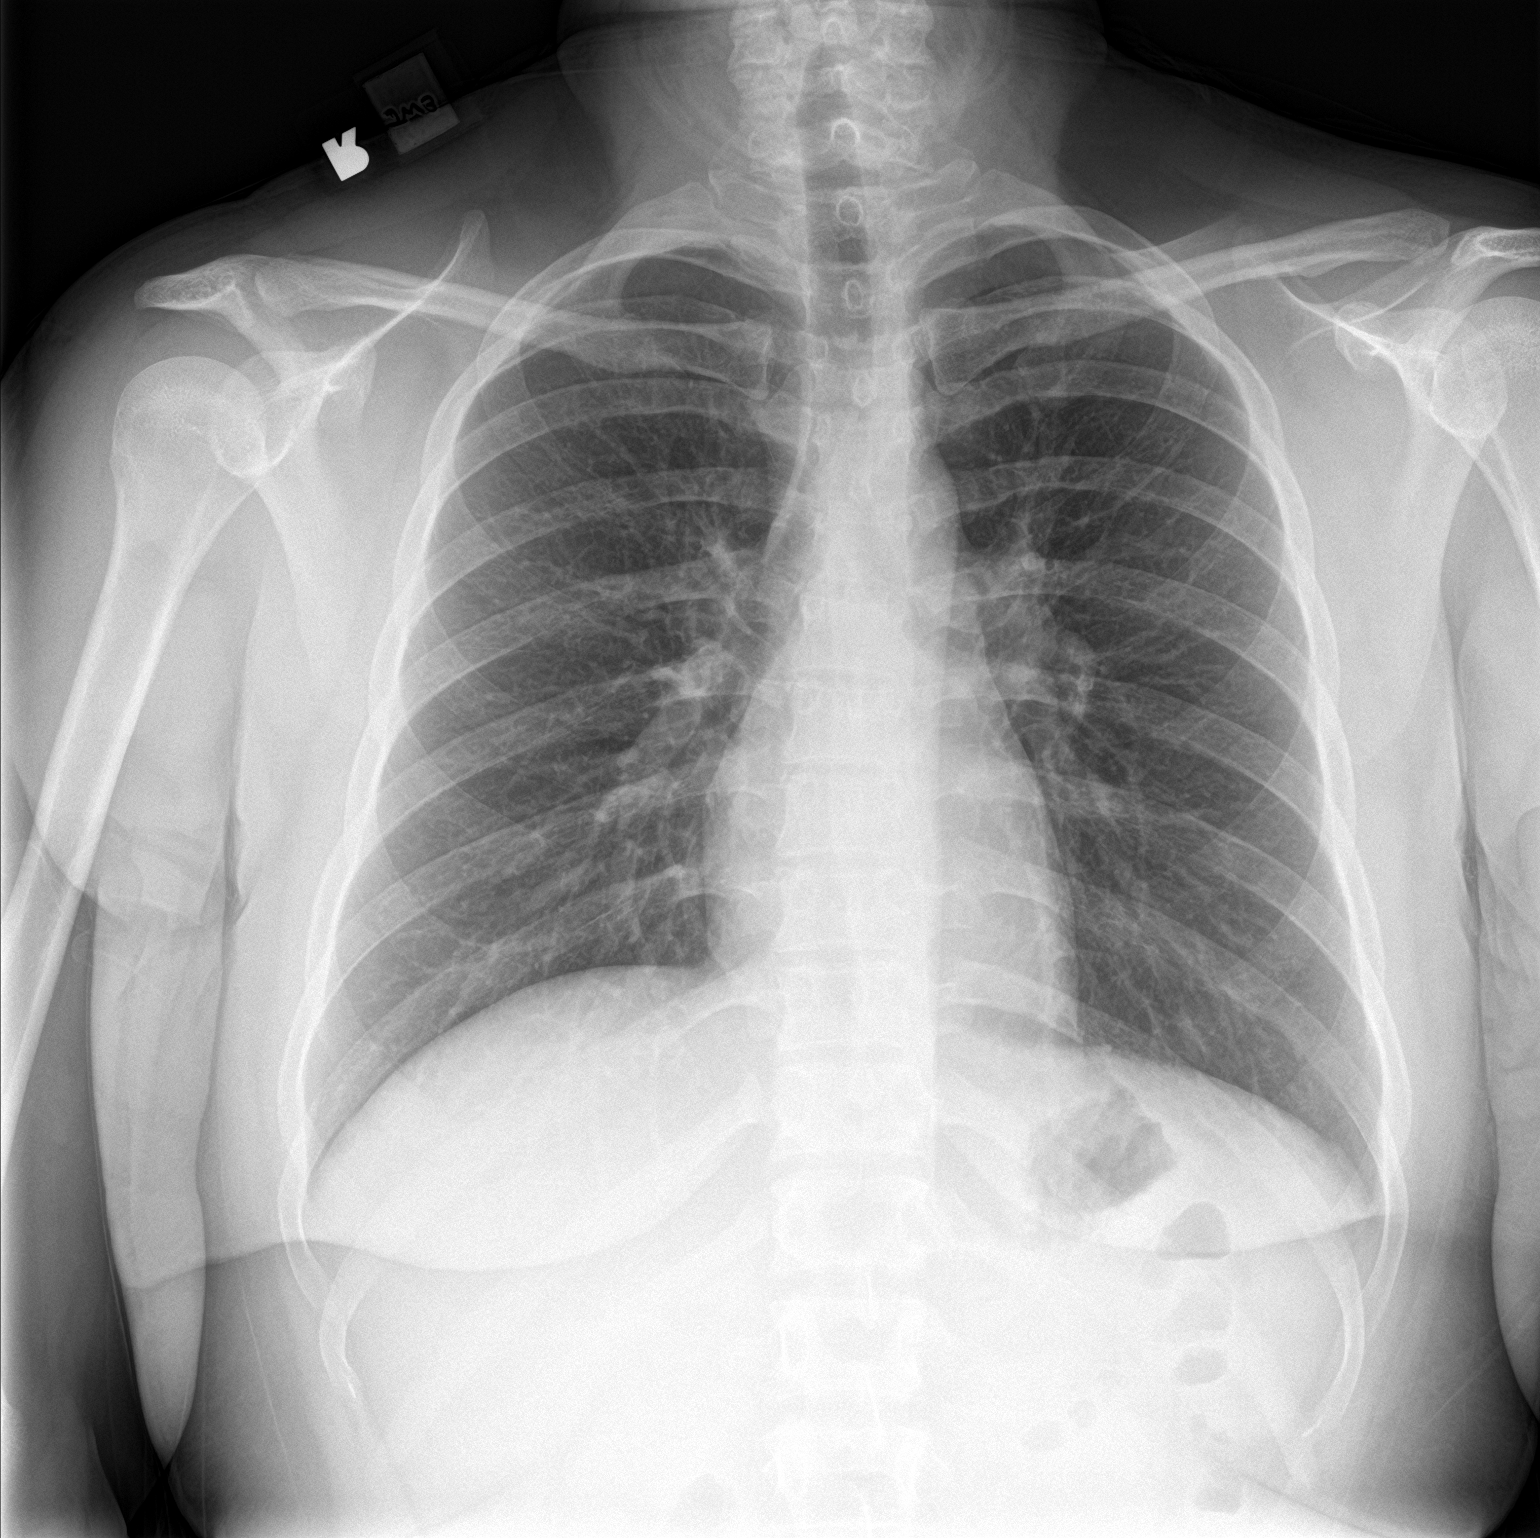

[chest lat]
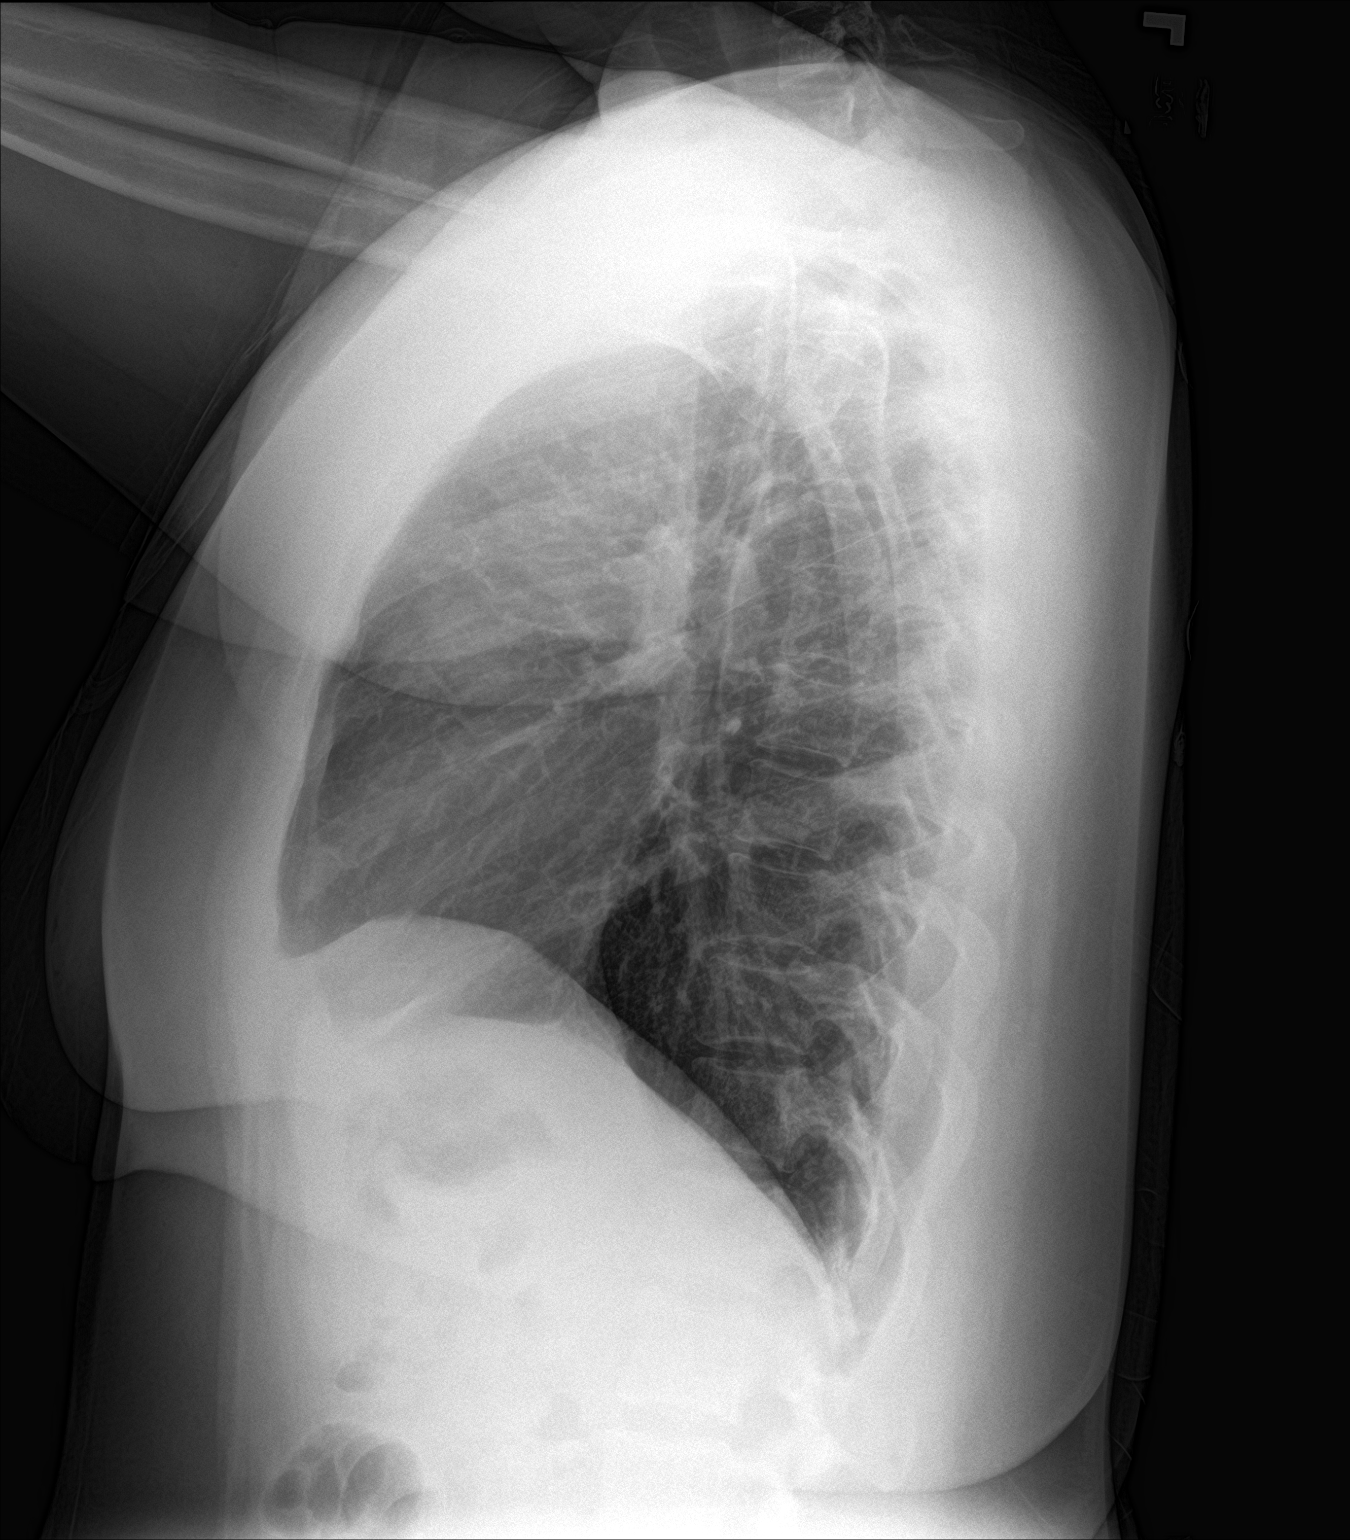

[2 of 2 positions shown; findings below may reference images not displayed]

FINDINGS: Cardiomediastinal silhouette is normal. Mediastinal contours appear
intact.

There is no evidence of focal airspace consolidation, pleural
effusion or pneumothorax.

Osseous structures are without acute abnormality. Soft tissues are
grossly normal.
IMPRESSION: No active cardiopulmonary disease.

## 2019-08-17 ENCOUNTER — Emergency Department (HOSPITAL_COMMUNITY)
Admission: EM | Admit: 2019-08-17 | Discharge: 2019-08-17 | Disposition: A | Discharge status: Home / Self Care | Payer: Medicaid Other | Attending: Emergency Medicine | Admitting: Emergency Medicine

## 2019-08-17 ENCOUNTER — Encounter (HOSPITAL_COMMUNITY): Payer: Self-pay | Admitting: Emergency Medicine

## 2019-08-17 ENCOUNTER — Other Ambulatory Visit: Payer: Self-pay

## 2019-08-17 DIAGNOSIS — Z590 Homelessness: Secondary | ICD-10-CM | POA: Insufficient documentation

## 2019-08-17 DIAGNOSIS — T783XXA Angioneurotic edema, initial encounter: Secondary | ICD-10-CM | POA: Insufficient documentation

## 2019-08-17 DIAGNOSIS — F17210 Nicotine dependence, cigarettes, uncomplicated: Secondary | ICD-10-CM | POA: Insufficient documentation

## 2019-08-17 DIAGNOSIS — T7840XA Allergy, unspecified, initial encounter: Secondary | ICD-10-CM | POA: Insufficient documentation

## 2019-08-17 DIAGNOSIS — L509 Urticaria, unspecified: Secondary | ICD-10-CM

## 2019-08-17 LAB — BASIC METABOLIC PANEL
Anion gap: 8 (ref 5–15)
BUN: 10 mg/dL (ref 6–20)
CO2: 23 mmol/L (ref 22–32)
Calcium: 8.8 mg/dL — ABNORMAL LOW (ref 8.9–10.3)
Chloride: 108 mmol/L (ref 98–111)
Creatinine, Ser: 0.85 mg/dL (ref 0.44–1.00)
GFR calc Af Amer: >60 mL/min (ref >60)
GFR calc non Af Amer: >60 mL/min (ref >60)
Glucose, Bld: 104 mg/dL — ABNORMAL HIGH (ref 70–99)
Potassium: 4 mmol/L (ref 3.5–5.1)
Sodium: 139 mmol/L (ref 135–145)

## 2019-08-17 LAB — CBC
HCT: 40.8 % (ref 36.0–46.0)
Hemoglobin: 13.1 g/dL (ref 12.0–15.0)
MCH: 30 pg (ref 26.0–34.0)
MCHC: 32.1 g/dL (ref 30.0–36.0)
MCV: 93.6 fL (ref 80.0–100.0)
Platelets: 254 10*3/uL (ref 150–400)
RBC: 4.36 MIL/uL (ref 3.87–5.11)
RDW: 13.8 % (ref 11.5–15.5)
WBC: 6.3 10*3/uL (ref 4.0–10.5)
nRBC: 0 % (ref 0.0–0.2)

## 2019-08-17 LAB — I-STAT BETA HCG BLOOD, ED (MC, WL, AP ONLY): I-stat hCG, quantitative: <5 m[IU]/mL (ref <5)

## 2019-08-17 MED ORDER — DIPHENHYDRAMINE HCL 50 MG/ML IJ SOLN
25.0000 mg | Freq: Once | INTRAMUSCULAR | Status: AC
  Administered 2019-08-17: 25 mg via INTRAVENOUS
  Filled 2019-08-17: qty 1

## 2019-08-17 MED ORDER — PREDNISONE 20 MG PO TABS: ORAL_TABLET | ORAL | 0 refills | Status: DC

## 2019-08-17 MED ORDER — FAMOTIDINE IN NACL 20-0.9 MG/50ML-% IV SOLN
20.0000 mg | Freq: Once | INTRAVENOUS | Status: AC
  Administered 2019-08-17: 20 mg via INTRAVENOUS
  Filled 2019-08-17: qty 50

## 2019-08-17 MED ORDER — EPINEPHRINE 0.3 MG/0.3ML IJ SOAJ
0.3000 mg | Freq: Once | INTRAMUSCULAR | Status: AC
Start: 2019-08-17 — End: 2019-08-17
  Administered 2019-08-17: 0.3 mg via INTRAMUSCULAR
  Filled 2019-08-17: qty 0.3

## 2019-08-17 MED ORDER — FAMOTIDINE 20 MG PO TABS: 20.0000 mg | ORAL_TABLET | Freq: Two times a day (BID) | ORAL | 0 refills | Status: DC

## 2019-08-17 MED ORDER — CETIRIZINE HCL 10 MG PO TABS: 10.0000 mg | ORAL_TABLET | Freq: Two times a day (BID) | ORAL | 1 refills | Status: DC

## 2019-08-17 MED ORDER — METHYLPREDNISOLONE SODIUM SUCC 125 MG IJ SOLR
125.0000 mg | Freq: Once | INTRAMUSCULAR | Status: AC
  Administered 2019-08-17: 125 mg via INTRAVENOUS
  Filled 2019-08-17: qty 2

## 2019-08-17 MED ORDER — ONDANSETRON HCL 4 MG/2ML IJ SOLN
4.0000 mg | Freq: Once | INTRAMUSCULAR | Status: AC
  Administered 2019-08-17: 4 mg via INTRAVENOUS
  Filled 2019-08-17: qty 2

## 2019-08-17 NOTE — ED Notes (Signed)
Patient verbalizes understanding of discharge instructions. Opportunity for questioning and answers were provided. Armband removed by staff, pt discharged from ED home via POV.  

## 2019-08-17 NOTE — ED Provider Notes (Signed)
[facility] EMERGENCY DEPARTMENT Provider Note   CSN: [id number] Arrival date & time: 08/17/19  0326    History   Chief Complaint Chief Complaint  Patient presents with  . swollen lip    HPI Jane Finley is a 29 y.o. female with a hx of anxiety, bipolar disorder presents to the Emergency Department complaining of gradual, persistent, progressively worsening allergic reaction onset around 3 AM.  Patient reports she awoke for minutes up to 3 AM and noticed that her lip was swelling.  She reports it has since doubled in size.  She denies swelling in the back of her throat or tongue.  She does endorse some significant nausea but has not vomited.  Patient reports she has been struggling with recurrent urticaria over the last several months.  She has had several rounds of steroids but has not yet seen an allergist.  Patient reports intermittently taking Benadryl for her urticaria.  She does report she currently has a rash across her stomach.  Patient reports eating a bowl of tomatoes, okra and corn which she has not eaten in a while but has not previously had allergic reactions to.  No other treatments prior to arrival.  Patient denies headache, neck pain, chest pain, shortness of breath, weakness, dizziness, syncope.     The history is provided by the patient and medical records. No language interpreter was used.    Past Medical History:  Diagnosis Date  . Anxiety   . Bipolar 1 disorder (HCC)   . Depression     Patient Active Problem List   Diagnosis Date Noted  . Postoperative state 03/25/2018  . Ectopic pregnancy, tubal 03/17/2018  . PTSD (post-traumatic stress disorder) 11/02/2013  . Depression with suicidal ideation 10/31/2013  . Polysubstance abuse (HCC) 10/31/2013  . Homeless 10/31/2013    Past Surgical History:  Procedure Laterality Date  . LAPAROSCOPIC UNILATERAL SALPINGECTOMY Right 03/16/2018   Procedure: LAPAROSCOPIC RIGHT SALPINGECTOMY FOR  MANAGEMENT OF ECTOPIC PREGNANCY;  Surgeon: Lazaro ArmsEure, Luther H, MD;  Location: [facility];  Service: Gynecology;  Laterality: Right;  . LAPAROSCOPY N/A 03/16/2018   Procedure: LAPAROSCOPY OPERATIVE;  Surgeon: Lazaro ArmsEure, Luther H, MD;  Location: [facility];  Service: Gynecology;  Laterality: N/A;  . LAPAROSCOPY N/A 03/25/2018   Procedure: LAPAROSCOPY DIAGNOSTIC;  Surgeon: Adam PhenixArnold, James G, MD;  Location: [facility];  Service: Gynecology;  Laterality: N/A;  . UNILATERAL SALPINGECTOMY Left 03/25/2018   Procedure: UNILATERAL SALPINGECTOMY;  Surgeon: Adam PhenixArnold, James G, MD;  Location: [facility];  Service: Gynecology;  Laterality: Left;     OB History    Gravida  3   Para  1   Term  1   Preterm      AB  1   Living  1     SAB  1   TAB      Ectopic      Multiple      Live Births  1            Home Medications    Prior to Admission medications   Medication Sig Start Date End Date Taking? Authorizing Provider  diphenhydrAMINE (BENADRYL) 25 MG tablet Take 25 mg by mouth every 6 (six) hours as needed for allergies.   Yes [provider]  cetirizine (ZYRTEC ALLERGY) 10 MG tablet Take 1 tablet (10 mg total) by mouth 2 (two) times daily. 08/17/19   Graceland Wachter, Dahlia ClientHannah, PA-C  famotidine (PEPCID) 20 MG tablet Take 1 tablet (20 mg total) by mouth 2 (  two) times daily. 08/17/19   Maicee Ullman, Jarrett Soho, PA-C  hydrocortisone 2.5 % lotion Apply topically 2 (two) times daily. Patient not taking: Reported on 08/17/2019 05/27/19   Zigmund Gottron, NP  predniSONE (DELTASONE) 20 MG tablet 3 tabs po daily x 3 days, then 2 tabs x 3 days, then 1.5 tabs x 3 days, then 1 tab x 3 days, then 0.5 tabs x 3 days 08/17/19   Jaley Yan, Jarrett Soho, PA-C    Family History No family history on file.  Social History Social History   Tobacco Use  . Smoking status: Current Some Day Smoker    Packs/day: 0.25    Years: 11.00    Pack years: 2.75    Types: Cigarettes  . Smokeless tobacco: Never Used  Substance Use Topics  .  Alcohol use: Yes  . Drug use: Yes    Types: Marijuana     Allergies   Patient has no known allergies.   Review of Systems Review of Systems  Constitutional: Negative for appetite change, diaphoresis, fatigue, fever and unexpected weight change.  HENT: Positive for facial swelling. Negative for mouth sores.   Eyes: Negative for visual disturbance.  Respiratory: Negative for cough, chest tightness, shortness of breath and wheezing.   Cardiovascular: Negative for chest pain.  Gastrointestinal: Positive for nausea. Negative for abdominal pain, constipation, diarrhea and vomiting.  Endocrine: Negative for polydipsia, polyphagia and polyuria.  Genitourinary: Negative for dysuria, frequency, hematuria and urgency.  Musculoskeletal: Negative for back pain and neck stiffness.  Skin: Positive for rash.  Allergic/Immunologic: Negative for immunocompromised state.  Neurological: Negative for syncope, light-headedness and headaches.  Hematological: Does not bruise/bleed easily.  Psychiatric/Behavioral: Negative for sleep disturbance. The patient is not nervous/anxious.      Physical Exam Updated Vital Signs BP 122/79 (BP Location: Left Arm)   Pulse (!) 57   Temp 98.9 F (37.2 C) (Oral)   Resp 18   LMP 08/13/2019   SpO2 99%   Physical Exam Vitals signs and nursing note reviewed.  Constitutional:      General: She is not in acute distress.    Appearance: She is not diaphoretic.  HENT:     Head: Normocephalic.      Comments: No swelling of the uvula or tongue.    Mouth/Throat:     Mouth: Mucous membranes are moist.     Pharynx: No uvula swelling.  Eyes:     General: No scleral icterus.    Conjunctiva/sclera: Conjunctivae normal.  Neck:     Musculoskeletal: Normal range of motion.  Cardiovascular:     Rate and Rhythm: Normal rate and regular rhythm.     Pulses: Normal pulses.          Radial pulses are 2+ on the right side and 2+ on the left side.  Pulmonary:     Effort:  No tachypnea, accessory muscle usage, prolonged expiration, respiratory distress or retractions.     Breath sounds: No stridor.     Comments: Equal chest rise. No increased work of breathing. Abdominal:     General: There is no distension.     Palpations: Abdomen is soft.     Tenderness: There is no abdominal tenderness. There is no guarding or rebound.  Musculoskeletal:     Comments: Moves all extremities equally and without difficulty.  Skin:    General: Skin is warm and dry.     Capillary Refill: Capillary refill takes less than 2 seconds.     Findings: Rash present. Rash  is urticarial.       Neurological:     Mental Status: She is alert.     GCS: GCS eye subscore is 4. GCS verbal subscore is 5. GCS motor subscore is 6.     Comments: Speech is clear and goal oriented.  Psychiatric:        Mood and Affect: Mood normal.      ED Treatments / Results  Labs (all labs ordered are listed, but only abnormal results are displayed) Labs Reviewed  BASIC METABOLIC PANEL - Abnormal; Notable for the following components:      Result Value   Glucose, Bld 104 (*)    Calcium 8.8 (*)    All other components within normal limits  CBC  I-STAT BETA HCG BLOOD, ED ([facility], [facility], [facility] ONLY)    Procedures .Critical Care Performed by: Dierdre ForthMuthersbaugh, Boby Eyer, PA-C Authorized by: Dierdre ForthMuthersbaugh, Bernell Haynie, PA-C   Critical care provider statement:    Critical care time (minutes):  45   Critical care time was exclusive of:  Separately billable procedures and treating other patients and teaching time   Critical care was necessary to treat or prevent imminent or life-threatening deterioration of the following conditions:  Circulatory failure   Critical care was time spent personally by me on the following activities:  Discussions with consultants, evaluation of patient's response to treatment, examination of patient, ordering and performing treatments and interventions, ordering and review of laboratory studies,  ordering and review of radiographic studies, pulse oximetry, re-evaluation of patient's condition, obtaining history from patient or surrogate and review of old charts   I assumed direction of critical care for this patient from another provider in my specialty: no     (including critical care time)  Medications Ordered in ED Medications  diphenhydrAMINE (BENADRYL) injection 25 mg (25 mg Intravenous Given 08/17/19 0511)  EPINEPHrine (EPI-PEN) injection 0.3 mg (0.3 mg Intramuscular Given 08/17/19 0503)  ondansetron (ZOFRAN) injection 4 mg (4 mg Intravenous Given 08/17/19 0509)  methylPREDNISolone sodium succinate (SOLU-MEDROL) 125 mg/2 mL injection 125 mg (125 mg Intravenous Given 08/17/19 0513)  famotidine (PEPCID) IVPB 20 mg premix (0 mg Intravenous Stopped 08/17/19 0536)     Initial Impression / Assessment and Plan / ED Course  I have reviewed the triage vital signs and the nursing notes.  Pertinent labs & imaging results that were available during my care of the patient were reviewed by me and considered in my medical decision making (see chart for details).  Clinical Course as of Aug 16 652  [facility]  [id number] Some improvement.  No increased swelling.  Handling secretions.  No stridor or drooling.  Urticaria improved.    [HM]    Clinical Course User Index [HM] Sharief Wainwright, Dahlia ClientHannah, PA-C       Pt presents with urticaria, nausea and lip swelling.  Concern for anaphylaxis.  Patient given epi, Benadryl, Solu-Medrol, Pepcid and Zofran.  No airway compromise at this time.  Will obs for 4 hours.  6:53 AM Patient reports she continues to feel well.  Lip swelling has decreased slightly but has not spread.  No swelling of her tongue or posterior oropharynx.  Urticaria slightly improved.  Nausea is gone patient has not vomited.  She has tolerated p.o. trial without difficulty.  Given patient's chronic nature of her urticaria now with angioedema and no known trigger (including no recent  NSAIDs or ACEs) patient will need asthma and allergy follow-up.  She has no PCP therefore will be referred to  the wellness center for ER follow-up and help navigating allergy follow-up.  Patient given steroids, Zyrtec and Pepcid at discharge.  Patient given steroid taper as opposed to burst given recurrent urticaria and seeming return of symptoms after burst is completed.  Discussed reasons to return immediately to the emergency department.  Patient and friend state understanding and are in agreement with the plan.  Final Clinical Impressions(s) / ED Diagnoses   Final diagnoses:  Allergic reaction, initial encounter  Urticaria  Angioedema of lips, initial encounter    ED Discharge Orders         Ordered    famotidine (PEPCID) 20 MG tablet  2 times daily     08/17/19 0648    predniSONE (DELTASONE) 20 MG tablet     08/17/19 0648    cetirizine (ZYRTEC ALLERGY) 10 MG tablet  2 times daily     08/17/19 0648           Ladavia Lindenbaum, Dahlia ClientHannah, PA-C 08/17/19 0653    Mesner, Barbara CowerJason, MD 08/17/19 [id number]

## 2019-08-17 NOTE — Discharge Instructions (Addendum)
1. Medications: Prednisone, Benadryl, Pepcid, Zyrtec, usual home medications 2. Treatment: rest, drink plenty of fluids, take medications as prescribed 3. Follow Up: Please followup with [facility] in 2-3 days for discussion of your diagnoses and further evaluation after today's ER visit; Please also follow-up with [facility]; Return to the ER for difficulty breathing, return of allergic reaction or other concerning symptoms

## 2019-08-17 NOTE — ED Notes (Signed)
IV removed from pt's right AC. IV site was clean, dry, and intact.

## 2019-08-17 NOTE — ED Triage Notes (Signed)
Woke up at 10pm with swelling to bottom lip.  No known allergies.  Ate tomatoes, okra, corn, and rice before going to bed.

## 2019-08-20 IMAGING — US US OB TRANSVAGINAL
1 series · 15 of 28 positions shown · non-contrast
Comparison: 03/16/18

CLINICAL DATA: Recent unilateral salpingectomy for ectopic
pregnancy.

EXAM:
TRANSVAGINAL OB ULTRASOUND
TECHNIQUE: Transvaginal ultrasound was performed for complete evaluation of the
gestation as well as the maternal uterus, adnexal regions, and
pelvic cul-de-sac.

[Series 1: us ob transvaginal · 81 acquisitions, 15 frames shown]
[im 1/81]
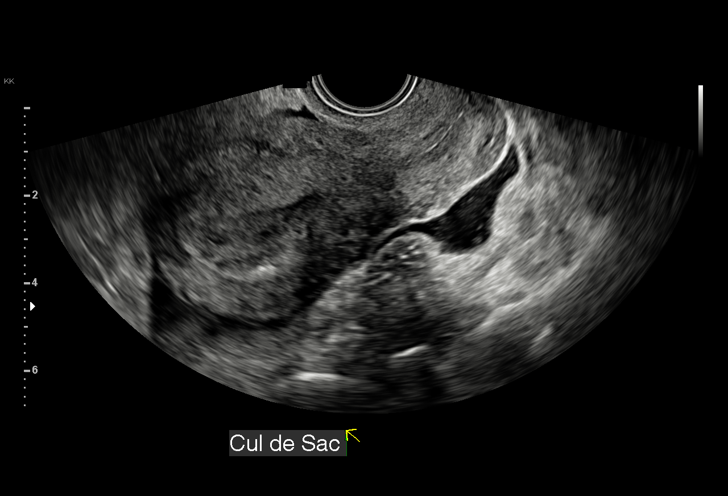
[im 6/81]
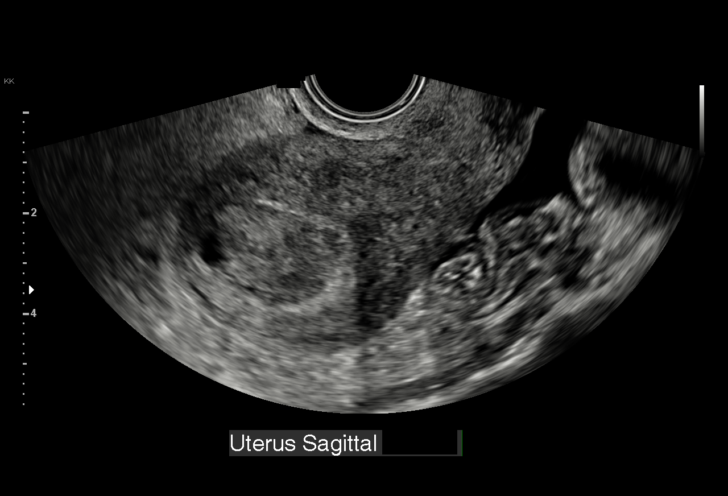
[im 12/81]
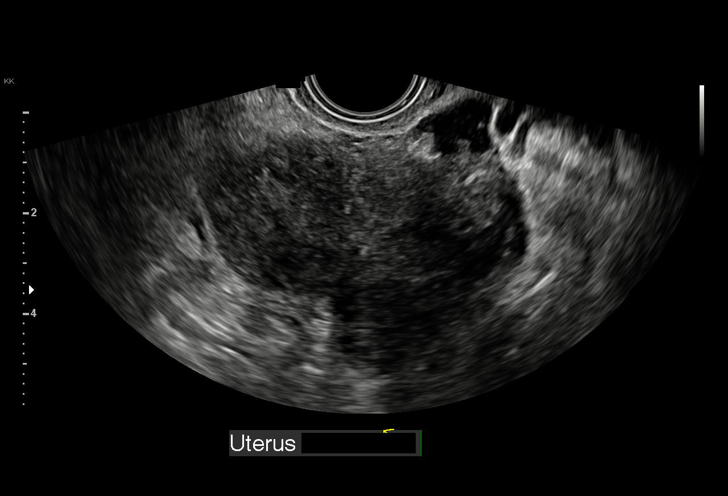
[im 18/81]
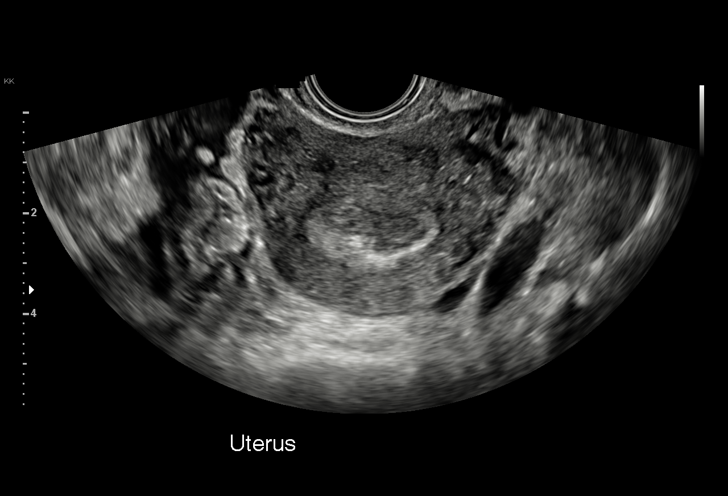
[im 24/81]
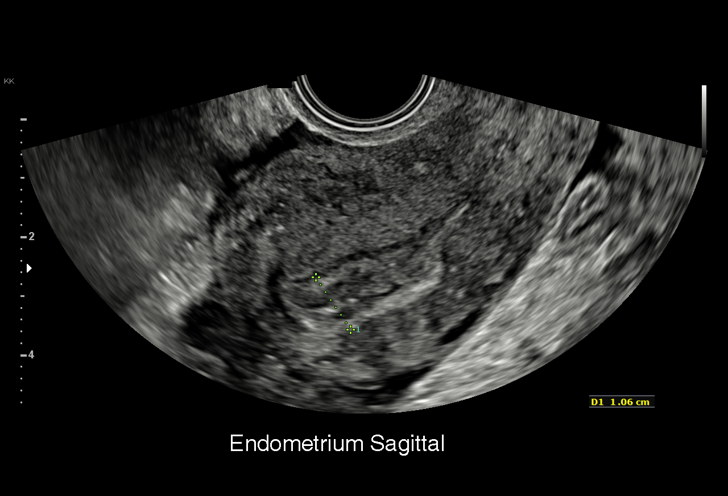
[im 30/81]
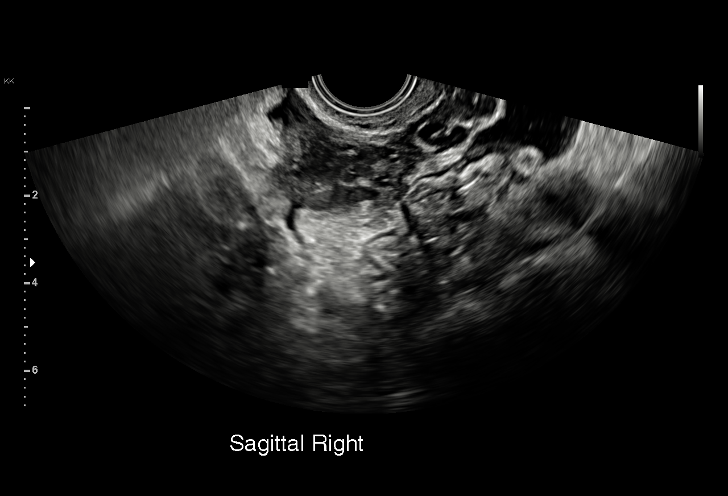
[im 36/81]
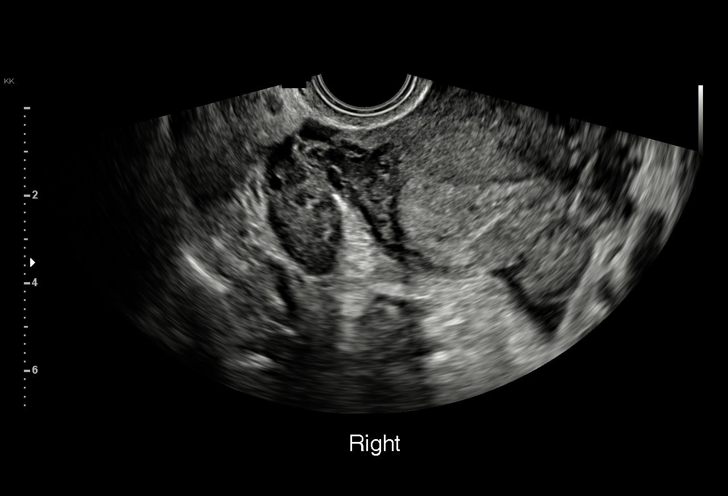
[im 42/81]
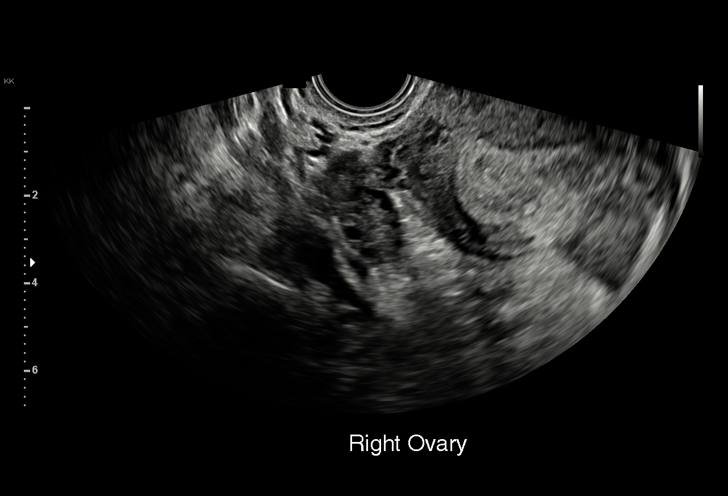
[im 45/81]
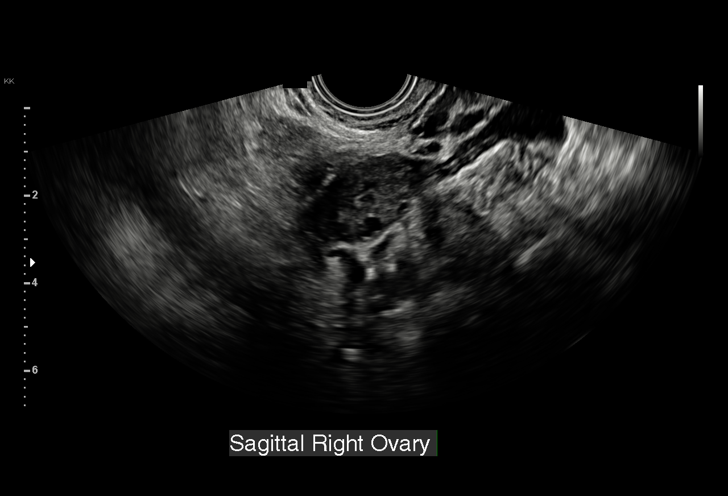
[im 51/81]
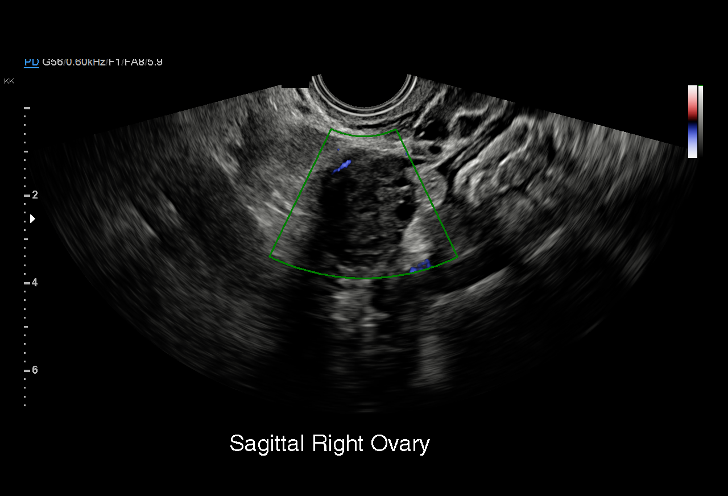
[im 57/81]
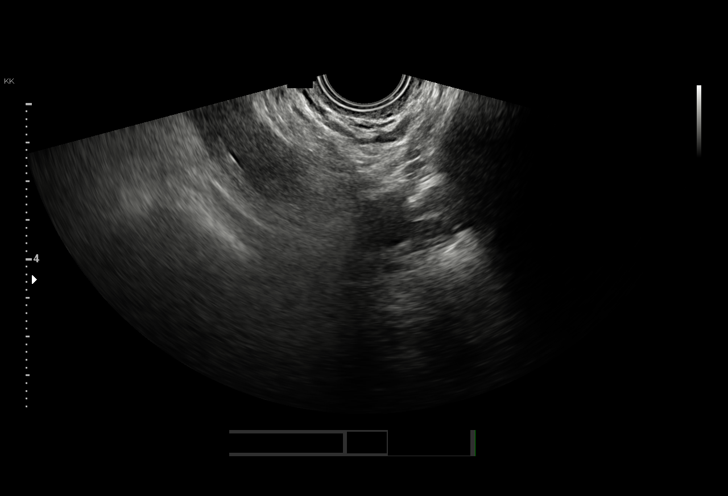
[im 63/81]
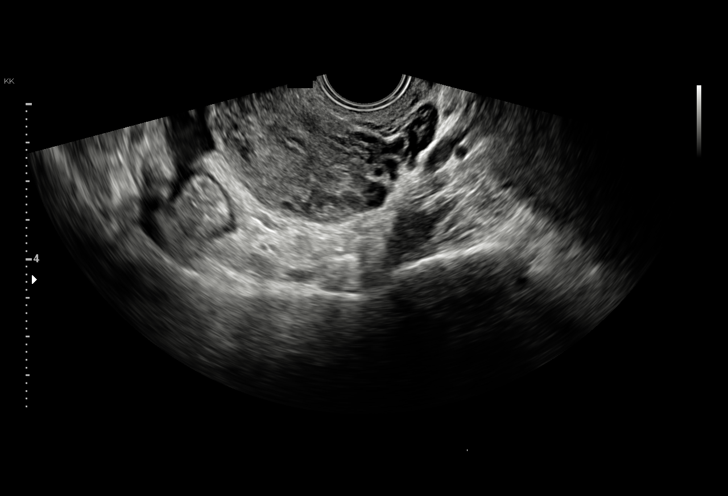
[im 69/81]
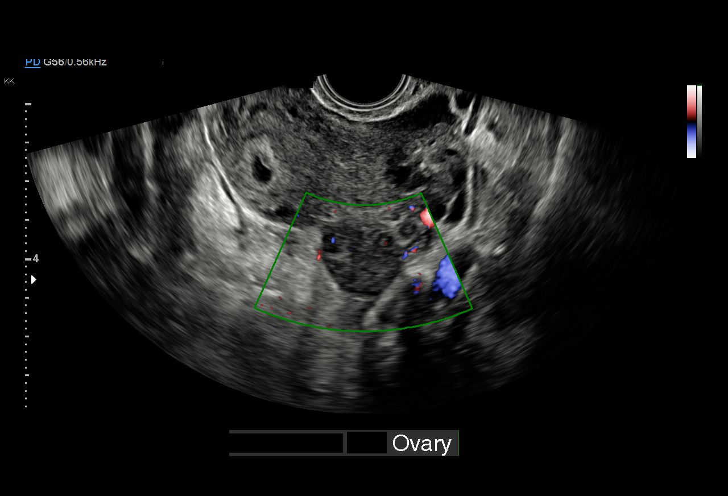
[im 75/81]
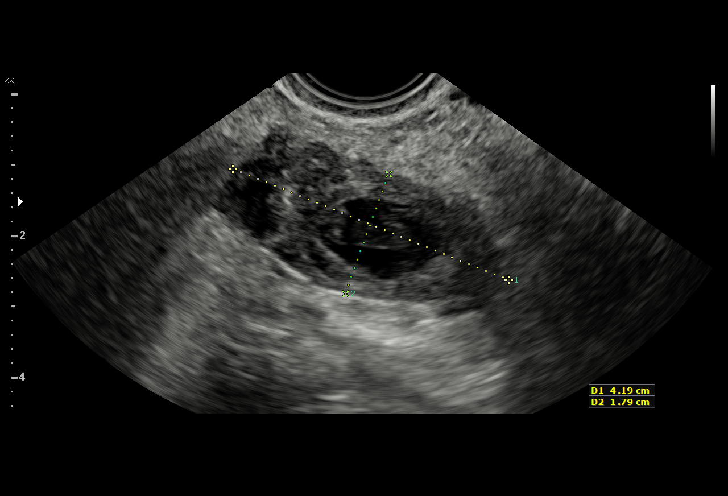
[im 81/81]
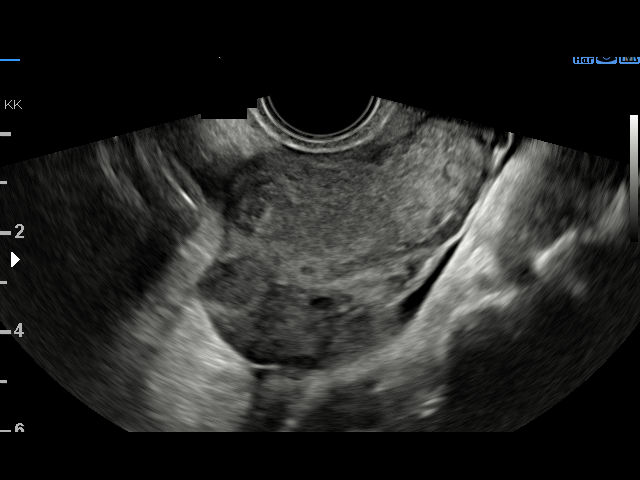

[15 of 28 positions shown; findings below may reference images not displayed]

FINDINGS: Intrauterine gestational sac: None

Yolk sac:  Not Visualized.

Embryo:  Not Visualized.

Cardiac Activity: Not Visualized.

Maternal uterus/adnexae:

Subchorionic hemorrhage: Not applicable

Right ovary: Appears normal

Left ovary: Appears normal

Other :There is a mass adjacent to the left side of the uterine
fundus containing a viable ectopic pregnancy. The crown-rump length
measures 11.8 mm corresponding to a 7 week 2 day gestation.
Previously 6 weeks 1 day.

Free fluid: There is a moderate amount of free fluid within the
pelvis.
IMPRESSION: Again seen is a viable ectopic pregnancy. On today's study this
appears adjacent to the left side of the uterine fundus.

Critical Value/emergent results were called by telephone at the time
of interpretation on 03/25/2018 at [DATE] to Mariaserena Bella,
who verbally acknowledged these results.

## 2019-08-24 NOTE — Progress Notes (Signed)
Patient ID: Jane Finley, female   DOB: [birth year], 29 y.o.   MRN: [id number]   Virtual Visit via Telephone Note  I connected with Jane Finley on 08/26/19 at  1:30 PM EDT by telephone and verified that I am speaking with the correct person using two identifiers.   I discussed the limitations, risks, security and privacy concerns of performing an evaluation and management service by telephone and the availability of in person appointments. I also discussed with the patient that there may be a patient responsible charge related to this service. The patient expressed understanding and agreed to proceed.  Patient location: home  My Location:  [facility] office Persons on the call:  Me and the patient   History of Present Illness: After ED visit 8/17/202 for allergic reaction.  She did not get the prednisone, pepcid, or zyrtec filled.  She is still having hives on and off w/o knowing the etiology.  Denies SOB or throat swelling/stridor.  Denies further lip swelling.    From ED A/P: Pt presents with urticaria, nausea and lip swelling.  Concern for anaphylaxis.  Patient given epi, Benadryl, Solu-Medrol, Pepcid and Zofran.  No airway compromise at this time.  Will obs for 4 hours.  6:53 AM Patient reports she continues to feel well.  Lip swelling has decreased slightly but has not spread.  No swelling of her tongue or posterior oropharynx.  Urticaria slightly improved.  Nausea is gone patient has not vomited.  She has tolerated p.o. trial without difficulty.  Given patient's chronic nature of her urticaria now with angioedema and no known trigger (including no recent NSAIDs or ACEs) patient will need asthma and allergy follow-up.  She has no PCP therefore will be referred to the wellness center for ER follow-up and help navigating allergy follow-up.  Patient given steroids, Zyrtec and Pepcid at discharge.  Patient given steroid taper as opposed to burst given recurrent urticaria and seeming return of  symptoms after burst is completed.  Discussed reasons to return immediately to the emergency department.   Observations/Objective:  A&Ox3   Assessment and Plan: 1. Allergic reaction, initial encounter Sent meds here so she can hopefully afford meds here - cetirizine (ZYRTEC ALLERGY) 10 MG tablet; Take 1 tablet (10 mg total) by mouth 2 (two) times daily.  Dispense: 30 tablet; Refill: 1 - famotidine (PEPCID) 20 MG tablet; Take 1 tablet (20 mg total) by mouth 2 (two) times daily.  Dispense: 30 tablet; Refill: 1 - predniSONE (DELTASONE) 20 MG tablet; 3 tabs po daily x 3 days, then 2 tabs x 3 days, then 1.5 tabs x 3 days, then 1 tab x 3 days, then 0.5 tabs x 3 days  Dispense: 27 tablet; Refill: 0 - Ambulatory referral to Allergy  Follow Up Instructions: Assign PCP 1-2 months    I discussed the assessment and treatment plan with the patient. The patient was provided an opportunity to ask questions and all were answered. The patient agreed with the plan and demonstrated an understanding of the instructions.   The patient was advised to call back or seek an in-person evaluation if the symptoms worsen or if the condition fails to improve as anticipated.  I provided 10 minutes of non-face-to-face time during this encounter.   Freeman Caldron, PA-C

## 2019-08-26 ENCOUNTER — Other Ambulatory Visit: Payer: Self-pay

## 2019-08-26 ENCOUNTER — Ambulatory Visit: Payer: Self-pay | Attending: Family Medicine | Admitting: Physician Assistant

## 2019-08-26 DIAGNOSIS — L500 Allergic urticaria: Secondary | ICD-10-CM

## 2019-08-26 DIAGNOSIS — T7840XA Allergy, unspecified, initial encounter: Secondary | ICD-10-CM

## 2019-08-26 MED ORDER — CETIRIZINE HCL 10 MG PO TABS: 10.0000 mg | ORAL_TABLET | Freq: Two times a day (BID) | ORAL | 1 refills | Status: DC

## 2019-08-26 MED ORDER — FAMOTIDINE 20 MG PO TABS: 20.0000 mg | ORAL_TABLET | Freq: Two times a day (BID) | ORAL | 1 refills | Status: DC

## 2019-08-26 MED ORDER — PREDNISONE 20 MG PO TABS: ORAL_TABLET | ORAL | 0 refills | Status: DC

## 2019-08-26 MED FILL — predniSONE 20 MG TABS: 20 | 15 days supply | Qty: 24 | Fill #0

## 2019-09-12 ENCOUNTER — Encounter (HOSPITAL_COMMUNITY): Payer: Self-pay | Admitting: *Deleted

## 2019-09-12 ENCOUNTER — Other Ambulatory Visit: Payer: Self-pay

## 2019-09-12 ENCOUNTER — Emergency Department (HOSPITAL_COMMUNITY)
Admission: EM | Admit: 2019-09-12 | Discharge: 2019-09-12 | Disposition: A | Discharge status: Home / Self Care | Payer: Medicaid Other | Attending: Emergency Medicine | Admitting: Emergency Medicine

## 2019-09-12 DIAGNOSIS — F17210 Nicotine dependence, cigarettes, uncomplicated: Secondary | ICD-10-CM | POA: Insufficient documentation

## 2019-09-12 DIAGNOSIS — Z590 Homelessness: Secondary | ICD-10-CM | POA: Insufficient documentation

## 2019-09-12 DIAGNOSIS — T7840XA Allergy, unspecified, initial encounter: Secondary | ICD-10-CM | POA: Insufficient documentation

## 2019-09-12 MED ORDER — PREDNISONE 10 MG PO TABS: 20.0000 mg | ORAL_TABLET | Freq: Two times a day (BID) | ORAL | 0 refills | Status: DC

## 2019-09-12 MED ORDER — DIPHENHYDRAMINE HCL 50 MG/ML IJ SOLN
25.0000 mg | Freq: Once | INTRAMUSCULAR | Status: AC
  Administered 2019-09-12: 05:00:00 25 mg via INTRAVENOUS
  Filled 2019-09-12: qty 1

## 2019-09-12 MED ORDER — METHYLPREDNISOLONE SODIUM SUCC 125 MG IJ SOLR
125.0000 mg | Freq: Once | INTRAMUSCULAR | Status: AC
  Administered 2019-09-12: 125 mg via INTRAVENOUS
  Filled 2019-09-12: qty 2

## 2019-09-12 NOTE — ED Triage Notes (Signed)
The pt woke up at 0230 c/o an allergic reaction upper lip swollen  utching all over  Hives over her body  lmp today

## 2019-09-12 NOTE — ED Provider Notes (Signed)
[facility]   CSN: [id number] Arrival date & time: 09/12/19  0309     History   Chief Complaint Chief Complaint  Patient presents with  . Allergic Reaction    HPI Royetta CarBryana Couvillon is a 29 y.o. female.     Patient is a 29 year old female with history of Anxiety, Bipolar, Depression.  She presents today with complaints of rash and swelling to the left upper lip and left upper arm.  No new contacts or exposures.  Patient with a similar episode one month ago treated with steroids and antihistamines.  Has an upcoming appointment with an allergist on the 17th.  The history is provided by the patient.  Allergic Reaction Presenting symptoms: itching, rash and swelling   Severity:  Moderate Duration:  1 day Relieved by:  Nothing Worsened by:  Nothing Ineffective treatments:  None tried   Past Medical History:  Diagnosis Date  . Anxiety   . Bipolar 1 disorder (HCC)   . Depression     Patient Active Problem List   Diagnosis Date Noted  . Postoperative state 03/25/2018  . Ectopic pregnancy, tubal 03/17/2018  . PTSD (post-traumatic stress disorder) 11/02/2013  . Depression with suicidal ideation 10/31/2013  . Polysubstance abuse (HCC) 10/31/2013  . Homeless 10/31/2013    Past Surgical History:  Procedure Laterality Date  . LAPAROSCOPIC UNILATERAL SALPINGECTOMY Right 03/16/2018   Procedure: LAPAROSCOPIC RIGHT SALPINGECTOMY FOR MANAGEMENT OF ECTOPIC PREGNANCY;  Surgeon: Lazaro ArmsEure, Luther H, MD;  Location: [facility];  Service: Gynecology;  Laterality: Right;  . LAPAROSCOPY N/A 03/16/2018   Procedure: LAPAROSCOPY OPERATIVE;  Surgeon: Lazaro ArmsEure, Luther H, MD;  Location: [facility];  Service: Gynecology;  Laterality: N/A;  . LAPAROSCOPY N/A 03/25/2018   Procedure: LAPAROSCOPY DIAGNOSTIC;  Surgeon: Adam PhenixArnold, James G, MD;  Location: [facility];  Service: Gynecology;  Laterality: N/A;  . UNILATERAL SALPINGECTOMY Left 03/25/2018   Procedure: UNILATERAL  SALPINGECTOMY;  Surgeon: Adam PhenixArnold, James G, MD;  Location: [facility];  Service: Gynecology;  Laterality: Left;     OB History    Gravida  3   Para  1   Term  1   Preterm      AB  1   Living  1     SAB  1   TAB      Ectopic      Multiple      Live Births  1            Home Medications    Prior to Admission medications   Medication Sig Start Date End Date Taking? Authorizing Provider  cetirizine (ZYRTEC ALLERGY) 10 MG tablet Take 1 tablet (10 mg total) by mouth 2 (two) times daily. 08/26/19   Anders SimmondsMcClung, Angela M, PA-C  diphenhydrAMINE (BENADRYL) 25 MG tablet Take 25 mg by mouth every 6 (six) hours as needed for allergies.    [provider]  famotidine (PEPCID) 20 MG tablet Take 1 tablet (20 mg total) by mouth 2 (two) times daily. 08/26/19   Anders SimmondsMcClung, Angela M, PA-C  hydrocortisone 2.5 % lotion Apply topically 2 (two) times daily. Patient not taking: Reported on 08/17/2019 05/27/19   Georgetta HaberBurky, Natalie B, NP  predniSONE (DELTASONE) 20 MG tablet 3 tabs po daily x 3 days, then 2 tabs x 3 days, then 1.5 tabs x 3 days, then 1 tab x 3 days, then 0.5 tabs x 3 days 08/26/19   Anders SimmondsMcClung, Angela M, PA-C    Family History No family  history on file.  Social History Social History   Tobacco Use  . Smoking status: Current Some Day Smoker    Packs/day: 0.25    Years: 11.00    Pack years: 2.75    Types: Cigarettes  . Smokeless tobacco: Never Used  Substance Use Topics  . Alcohol use: Yes  . Drug use: Yes    Types: Marijuana     Allergies   Patient has no known allergies.   Review of Systems Review of Systems  Skin: Positive for itching and rash.  All other systems reviewed and are negative.    Physical Exam Updated Vital Signs BP 110/62 (BP Location: Right Arm)   Pulse (!) 57   Temp 98.6 F (37 C)   Resp 16   Ht 5\' 2"  (1.575 m)   Wt 83 kg   LMP 08/13/2019   SpO2 99%   BMI 33.47 kg/m   Physical Exam Vitals signs and nursing note reviewed.   Constitutional:      General: She is not in acute distress.    Appearance: She is well-developed. She is not diaphoretic.  HENT:     Head: Normocephalic and atraumatic.     Mouth/Throat:     Comments: There is swelling and edema to the left upper lip.  There is no intraoral involvement.  There is no swelling of the neck.  Voice is normal and patient able to open and close her mouth without difficulty. Neck:     Musculoskeletal: Normal range of motion and neck supple.  Cardiovascular:     Rate and Rhythm: Normal rate and regular rhythm.     Heart sounds: No murmur. No friction rub. No gallop.   Pulmonary:     Effort: Pulmonary effort is normal. No respiratory distress.     Breath sounds: Normal breath sounds. No wheezing.  Abdominal:     General: Bowel sounds are normal. There is no distension.     Palpations: Abdomen is soft.     Tenderness: There is no abdominal tenderness.  Musculoskeletal: Normal range of motion.  Skin:    General: Skin is warm and dry.     Comments: There are several urticarial lesions to the inside of the left upper arm.  Neurological:     Mental Status: She is alert and oriented to person, place, and time.      ED Treatments / Results  Labs (all labs ordered are listed, but only abnormal results are displayed) Labs Reviewed - No data to display  EKG None  Radiology No results found.  Procedures Procedures (including critical care time)  Medications Ordered in ED Medications  diphenhydrAMINE (BENADRYL) injection 25 mg (has no administration in time range)  methylPREDNISolone sodium succinate (SOLU-MEDROL) 125 mg/2 mL injection 125 mg (has no administration in time range)     Initial Impression / Assessment and Plan / ED Course  I have reviewed the triage vital signs and the nursing notes.  Pertinent labs & imaging results that were available during my care of the patient were reviewed by me and considered in my medical decision making (see  chart for details).  Patient presenting here with swelling of her lip and rash to her arm.  These both appear allergic in nature.  She was given prednisone and Benadryl with improvement of symptoms.  Nothing suggest an anaphylactic reaction.  I feel as though patient appropriate for discharge.  She will be given prednisone and advised to take Benadryl for the next 2  days.  She has an upcoming appointment with an allergy specialist and I have advised her to keep this appointment.  Final Clinical Impressions(s) / ED Diagnoses   Final diagnoses:  None    ED Discharge Orders    None       Geoffery Lyons, MD 09/12/19 [phone]

## 2019-09-12 NOTE — Discharge Instructions (Addendum)
Prednisone as prescribed.  Benadryl 25 mg every 6 hours as needed for the next 3 days.  Follow-up with your allergist as scheduled next week, and return to the ER if you develop difficulty breathing or swallowing, chest pain, or other new and concerning symptoms.

## 2019-09-17 ENCOUNTER — Other Ambulatory Visit: Payer: Self-pay

## 2019-09-17 ENCOUNTER — Encounter: Payer: Self-pay | Admitting: Allergy

## 2019-09-17 ENCOUNTER — Ambulatory Visit (INDEPENDENT_AMBULATORY_CARE_PROVIDER_SITE_OTHER): Payer: Self-pay | Admitting: Allergy

## 2019-09-17 VITALS — BP 90/70 | HR 78 | Temp 97.3°F | Resp 16 | Ht 62.0 in | Wt 180.4 lb

## 2019-09-17 DIAGNOSIS — J310 Chronic rhinitis: Secondary | ICD-10-CM

## 2019-09-17 DIAGNOSIS — T783XXD Angioneurotic edema, subsequent encounter: Secondary | ICD-10-CM

## 2019-09-17 DIAGNOSIS — L508 Other urticaria: Secondary | ICD-10-CM

## 2019-09-17 MED ORDER — OLOPATADINE HCL 0.2 % OP SOLN: 1.0000 [drp] | Freq: Every day | OPHTHALMIC | 5 refills | Status: AC

## 2019-09-17 MED ORDER — OLOPATADINE HCL 0.2 % OP SOLN: 1.0000 [drp] | OPHTHALMIC | 5 refills | Status: DC

## 2019-09-17 MED ORDER — CETIRIZINE HCL 10 MG PO TABS: 10.0000 mg | ORAL_TABLET | Freq: Two times a day (BID) | ORAL | 5 refills | Status: DC

## 2019-09-17 MED ORDER — FLUTICASONE PROPIONATE 50 MCG/ACT NA SUSP: 2.0000 | Freq: Every day | NASAL | 5 refills | Status: AC

## 2019-09-17 MED ORDER — FAMOTIDINE 20 MG PO TABS: 20.0000 mg | ORAL_TABLET | Freq: Two times a day (BID) | ORAL | 5 refills | Status: DC

## 2019-09-17 NOTE — Addendum Note (Signed)
Addended by: Lucrezia Starch I on: 09/17/2019 06:52 PM   Modules accepted: Orders

## 2019-09-17 NOTE — Addendum Note (Signed)
Addended by: Neomia Dear on: 09/17/2019 05:56 PM   Modules accepted: Orders

## 2019-09-17 NOTE — Patient Instructions (Addendum)
Hives and swelling  - at this time etiology of hives and swelling is unknown.  Hives can be caused by a variety of different triggers including illness/infection, foods, medications, stings, exercise, pressure, vibrations, extremes of temperature to name a few however majority of the time there is no identifiable trigger.  Your symptoms have been ongoing for >6 weeks making this chronic thus will obtain labwork to evaluate: CBC w diff, CMP, tryptase, hive panel, environmental panel, alpha-gal panel, inflammatory markers and breath test for H.pylori  - for management of hives recommend starting with Zyrtec 10mg  1 tablet daily with Pepcid 20mg  1 tablet daily.    If you still have hives and swelling on this then increase to to 1 tablet twice a day of both medications.    If you still have hives and swelling on twice a day dosing then let us know.   At that time would recommend adding in Singulair daily.    - we discussed Xolair monthly injections to help with hive and swelling control if high-dose antihistamine regimen as above in not effective.    Allergies  - as above will obtain environmental allergy testing   - to treat your allergy symptoms you can take zyrtec as above  - for itchy/watery/red eyes can use Pataday 1 drop each eye daily as needed  - for nasal congestion can use Flonase 2 sprays each nostril daily as needed  Follow-up 2 months

## 2019-09-17 NOTE — Progress Notes (Signed)
New Patient Note  RE: Jane Finley MRN: [id number] DOB: 06-15-1990 Date of Office Visit: 09/17/2019  Referring provider: Argentina Donovan, PA-C Primary care provider: Patient, No Pcp Per  Chief Complaint:   History of present illness: Jane Finley is a 29 y.o. female presenting today for consultation for  She does not have a PCP at this time.    She states she has had 2 allergic reactions within the past month.  One on 8/17 and another on 9/12 both she reports for lip swelling.       She also having hives since March 2020.  She reports episode of hives for about 3-7 days about twice a month.   Hives are itchy and appear all over the body.  She does having arthritis and her arthritis pain has been worse.  She reports hives do not leave any marks/bruising.  She only report lip swelling as above.  No change in medications or foods No preceding illnesses.    She reports eating tomatoes right before one of her swelling episodes where she went to the ED.      She reports having goopiness of her eyes along with itching and watering.     Review of systems: ROS  All other systems negative unless noted above in HPI  Past medical history: Past Medical History:  Diagnosis Date  . Anxiety   . Bipolar 1 disorder ([place])   . Depression     Past surgical history: Past Surgical History:  Procedure Laterality Date  . LAPAROSCOPIC UNILATERAL SALPINGECTOMY Right 03/16/2018   Procedure: LAPAROSCOPIC RIGHT SALPINGECTOMY FOR MANAGEMENT OF ECTOPIC PREGNANCY;  Surgeon: Florian Buff, MD;  Location: [facility];  Service: Gynecology;  Laterality: Right;  . LAPAROSCOPY N/A 03/16/2018   Procedure: LAPAROSCOPY OPERATIVE;  Surgeon: Florian Buff, MD;  Location: [facility];  Service: Gynecology;  Laterality: N/A;  . LAPAROSCOPY N/A 03/25/2018   Procedure: LAPAROSCOPY DIAGNOSTIC;  Surgeon: Woodroe Mode, MD;  Location: [facility];  Service: Gynecology;  Laterality: N/A;  . UNILATERAL SALPINGECTOMY Left  03/25/2018   Procedure: UNILATERAL SALPINGECTOMY;  Surgeon: Woodroe Mode, MD;  Location: [facility];  Service: Gynecology;  Laterality: Left;    Family history:  History reviewed. No pertinent family history.  Social history: Lives in a home without carpeting with electric heating and window cooling.  No pets in the home.  Cats outside the home. No concern for water damage, mildew or roaches in the home.  She is a Sport and exercise psychologist. Tobacco Use  . Smoking status: Current Some Day Smoker    Packs/day: 0.25    Years: 11.00    Pack years: 2.75    Types: Cigarettes  . Smokeless tobacco: Never Used  Substance and Sexual Activity  . Alcohol use: Yes  . Drug use: Yes    Types: Marijuana    Medication List: Allergies as of 09/17/2019   No Known Allergies     Medication List       Accurate as of September 17, 2019  4:35 PM. If you have any questions, ask your nurse or doctor.        cetirizine 10 MG tablet Commonly known as: ZyrTEC Allergy Take 1 tablet (10 mg total) by mouth 2 (two) times daily.   diphenhydrAMINE 25 MG tablet Commonly known as: BENADRYL Take 25 mg by mouth every 6 (six) hours as needed for allergies.   famotidine 20 MG tablet Commonly known as: Pepcid Take 1 tablet (20 mg total) by  mouth 2 (two) times daily.   hydrocortisone 2.5 % lotion Apply topically 2 (two) times daily.   predniSONE 10 MG tablet Commonly known as: DELTASONE Take 2 tablets (20 mg total) by mouth 2 (two) times daily with a meal.       Known medication allergies: No Known Allergies   Physical examination: Blood pressure 90/70, pulse 78, temperature (!) 97.3 F (36.3 C), temperature source Temporal, resp. rate 16, height 5\' 2"  (1.575 m), weight 180 lb 6.4 oz (81.8 kg), SpO2 99 %.  General: Alert, interactive, in no acute distress. HEENT: PERRLA, TMs pearly gray, turbinates non-edematous without discharge, post-pharynx non erythematous. Neck: Supple without lymphadenopathy.  Lungs: Clear to auscultation without wheezing, rhonchi or rales. {no increased work of breathing. CV: Normal S1, S2 without murmurs. Abdomen: Nondistended, nontender. Skin: Warm and dry, without lesions or rashes. Extremities:  No clubbing, cyanosis or edema. Neuro:   Grossly intact.  Diagnositics/Labs: Allergy testing: deferred due to recent angioedema  Assessment and plan:   Chronic urticaria with angioedema   - at this time etiology of hives and swelling is unknown.  Hives can be caused by a variety of different triggers including illness/infection, foods, medications, stings, exercise, pressure, vibrations, extremes of temperature to name a few however majority of the time there is no identifiable trigger.  Your symptoms have been ongoing for >6 weeks making this chronic thus will obtain labwork to evaluate: CBC w diff, CMP, tryptase, hive panel, environmental panel, alpha-gal panel, inflammatory markers and breath test for H.pylori  - for management of hives recommend starting with Zyrtec 10mg  1 tablet daily with Pepcid 20mg  1 tablet daily.    If you still have hives and swelling on this then increase to to 1 tablet twice a day of both medications.    If you still have hives and swelling on twice a day dosing then let us know.   At that time would recommend adding in Singulair daily.    - we discussed Xolair monthly injections to help with hive and swelling control if high-dose antihistamine regimen as above in not effective.    Rhinitis/conjuctivitis, presumed allergic  - as above will obtain environmental allergy testing   - to treat your allergy symptoms you can take zyrtec as above  - for itchy/watery/red eyes can use Pataday 1 drop each eye daily as needed  - for nasal congestion can use Flonase 2 sprays each nostril daily as needed  Follow-up 2 months   I appreciate the opportunity to take part in Jane Finley's care. Please do not hesitate to contact me with questions.  Sincerely,    Margo AyeShaylar Padgett, MD Allergy/Immunology [facility]

## 2019-09-23 LAB — TRYPTASE: Tryptase: 3.7 ug/L (ref 2.2–13.2)

## 2019-09-23 LAB — IGE+ALLERGENS ZONE 2(30)
Alternaria Alternata IgE: <0.1 kU/L
Amer Sycamore IgE Qn: <0.1 kU/L
Aspergillus Fumigatus IgE: <0.1 kU/L
Bahia Grass IgE: <0.1 kU/L
Bermuda Grass IgE: <0.1 kU/L
Cat Dander IgE: <0.1 kU/L
Cedar, Mountain IgE: 0.11 kU/L — AB
Cladosporium Herbarum IgE: <0.1 kU/L
Cockroach, American IgE: <0.1 kU/L
Common Silver Birch IgE: <0.1 kU/L
D Farinae IgE: <0.1 kU/L
D Pteronyssinus IgE: 0.19 kU/L — AB
Dog Dander IgE: <0.1 kU/L
Elm, American IgE: <0.1 kU/L
Hickory, White IgE: <0.1 kU/L
IgE (Immunoglobulin E), Serum: 67 IU/mL (ref 6–495)
Johnson Grass IgE: 0.11 kU/L — AB
Maple/Box Elder IgE: <0.1 kU/L
Mucor Racemosus IgE: <0.1 kU/L
Mugwort IgE Qn: <0.1 kU/L
Nettle IgE: <0.1 kU/L
Oak, White IgE: 0.12 kU/L — AB
Penicillium Chrysogen IgE: <0.1 kU/L
Pigweed, Rough IgE: <0.1 kU/L
Plantain, English IgE: 0.11 kU/L — AB
Ragweed, Short IgE: <0.1 kU/L
Sheep Sorrel IgE Qn: <0.1 kU/L
Stemphylium Herbarum IgE: <0.1 kU/L
Sweet gum IgE RAST Ql: <0.1 kU/L
Timothy Grass IgE: <0.1 kU/L
White Mulberry IgE: <0.1 kU/L

## 2019-09-23 LAB — ALPHA-GAL PANEL
Alpha Gal IgE*: <0.1 kU/L (ref <0.10)
Beef (Bos spp) IgE: <0.1 kU/L (ref <0.35)
Class Interpretation: 0
Class Interpretation: 0
Class Interpretation: 0
Lamb/Mutton (Ovis spp) IgE: <0.1 kU/L (ref <0.35)
Pork (Sus spp) IgE: <0.1 kU/L (ref <0.35)

## 2019-09-23 LAB — COMPREHENSIVE METABOLIC PANEL
ALT: 16 IU/L (ref 0–32)
AST: 21 IU/L (ref 0–40)
Albumin/Globulin Ratio: 2 (ref 1.2–2.2)
Albumin: 4.1 g/dL (ref 3.9–5.0)
Alkaline Phosphatase: 70 IU/L (ref 39–117)
BUN/Creatinine Ratio: 14 (ref 9–23)
BUN: 12 mg/dL (ref 6–20)
Bilirubin Total: <0.2 mg/dL (ref 0.0–1.2)
CO2: 25 mmol/L (ref 20–29)
Calcium: 9.1 mg/dL (ref 8.7–10.2)
Chloride: 104 mmol/L (ref 96–106)
Creatinine, Ser: 0.85 mg/dL (ref 0.57–1.00)
GFR calc Af Amer: 109 mL/min/{1.73_m2} (ref >59)
GFR calc non Af Amer: 94 mL/min/{1.73_m2} (ref >59)
Globulin, Total: 2.1 g/dL (ref 1.5–4.5)
Glucose: 83 mg/dL (ref 65–99)
Potassium: 4.5 mmol/L (ref 3.5–5.2)
Sodium: 141 mmol/L (ref 134–144)
Total Protein: 6.2 g/dL (ref 6.0–8.5)

## 2019-09-23 LAB — THYROID ANTIBODIES
Thyroglobulin Antibody: <1 IU/mL (ref 0.0–0.9)
Thyroperoxidase Ab SerPl-aCnc: <9 IU/mL (ref 0–34)

## 2019-09-23 LAB — CBC WITH DIFFERENTIAL/PLATELET
Basophils Absolute: 0.1 10*3/uL (ref 0.0–0.2)
Basos: 1 %
EOS (ABSOLUTE): 0.1 10*3/uL (ref 0.0–0.4)
Eos: 2 %
Hematocrit: 44.2 % (ref 34.0–46.6)
Hemoglobin: 14.4 g/dL (ref 11.1–15.9)
Immature Grans (Abs): 0 10*3/uL (ref 0.0–0.1)
Immature Granulocytes: 0 %
Lymphocytes Absolute: 2.3 10*3/uL (ref 0.7–3.1)
Lymphs: 38 %
MCH: 29.5 pg (ref 26.6–33.0)
MCHC: 32.6 g/dL (ref 31.5–35.7)
MCV: 91 fL (ref 79–97)
Monocytes Absolute: 0.4 10*3/uL (ref 0.1–0.9)
Monocytes: 7 %
Neutrophils Absolute: 3.2 10*3/uL (ref 1.4–7.0)
Neutrophils: 52 %
Platelets: 331 10*3/uL (ref 150–450)
RBC: 4.88 x10E6/uL (ref 3.77–5.28)
RDW: 14.1 % (ref 11.7–15.4)
WBC: 6 10*3/uL (ref 3.4–10.8)

## 2019-09-23 LAB — CHRONIC URTICARIA: cu index: 2.8 (ref <10)

## 2019-09-23 LAB — SEDIMENTATION RATE: Sed Rate: 7 mm/hr (ref 0–32)

## 2019-11-19 ENCOUNTER — Ambulatory Visit: Payer: Self-pay | Admitting: Allergy

## 2020-02-13 IMAGING — US US OB < 14 WEEKS - US OB TV
1 series · 13 of 28 positions shown · non-contrast
Comparison: None.

CLINICAL DATA: Pelvic pain affecting pregnancy in the first
trimester.

EXAM:
OBSTETRIC <14 WK US AND TRANSVAGINAL OB US
TECHNIQUE: Both transabdominal and transvaginal ultrasound examinations were
performed for complete evaluation of the gestation as well as the
maternal uterus, adnexal regions, and pelvic cul-de-sac.
Transvaginal technique was performed to assess early pregnancy.

[Series 1: us ob < 14 weeks - us ob tv · 0.26mm/px · 108 acquisitions, 13 frames shown]
[im 4/108]
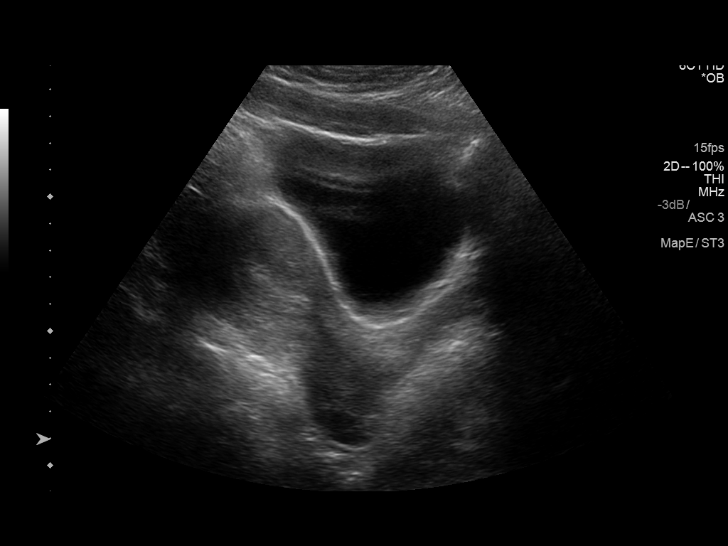
[im 12/108]
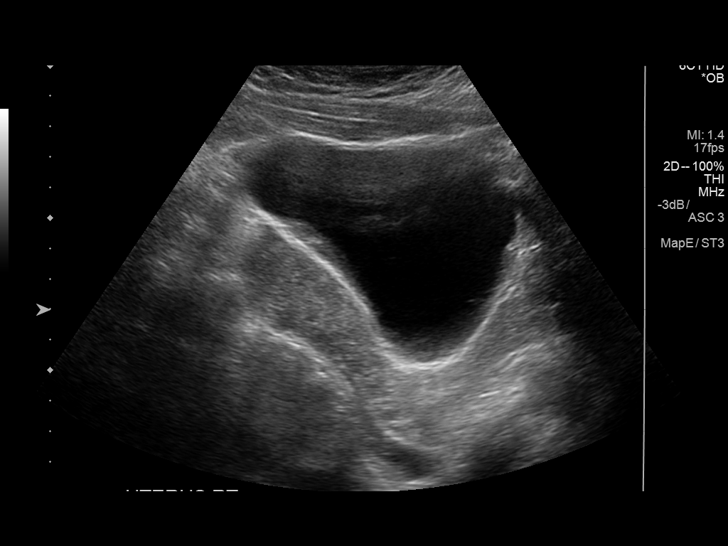
[im 20/108]
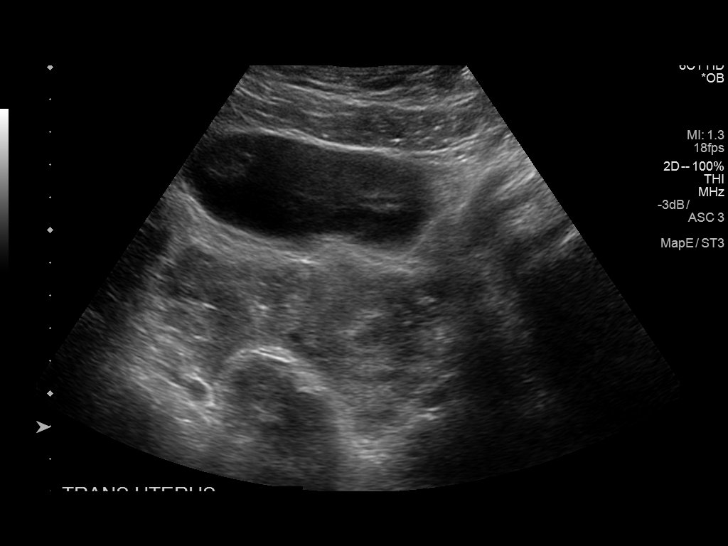
[im 28/108]
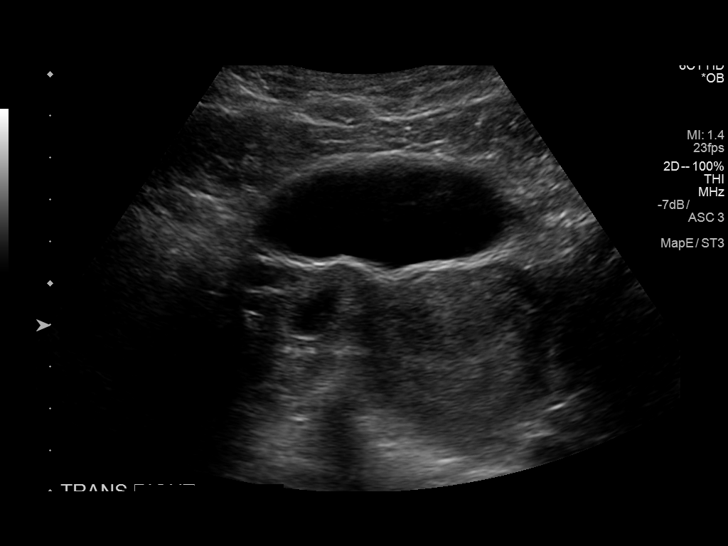
[im 36/108]
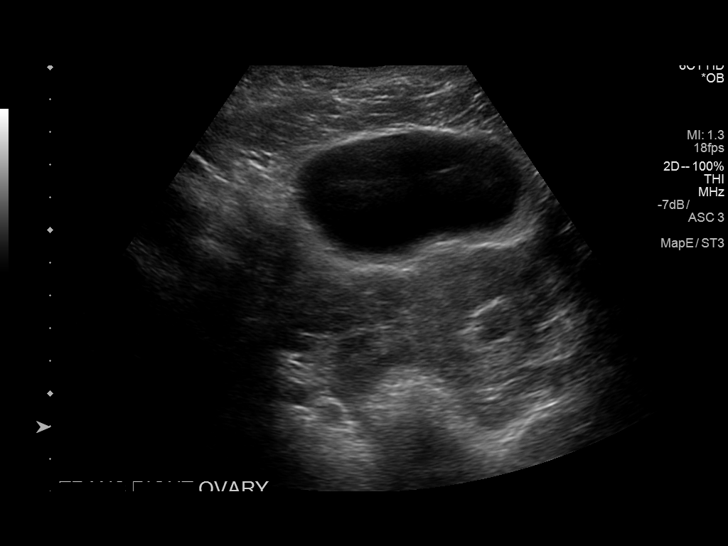
[im 44/108]
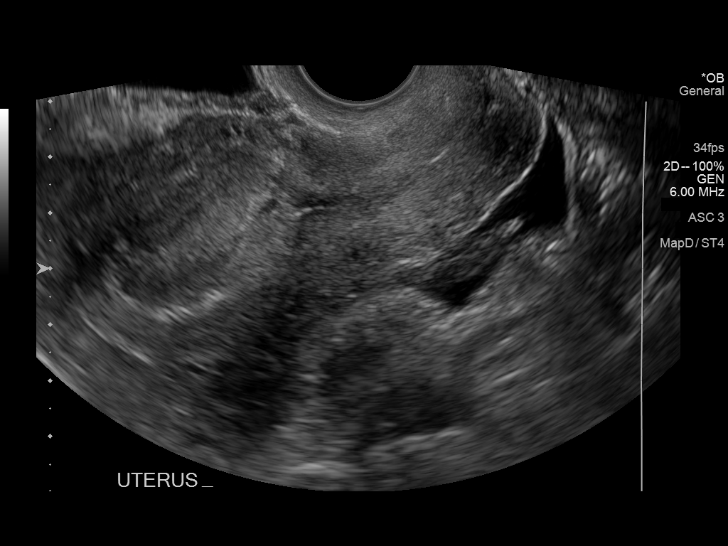
[im 56/108]
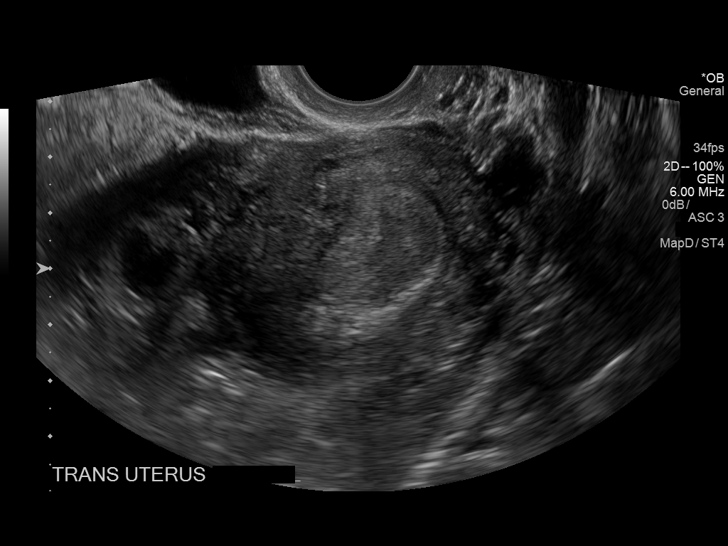
[im 64/108]
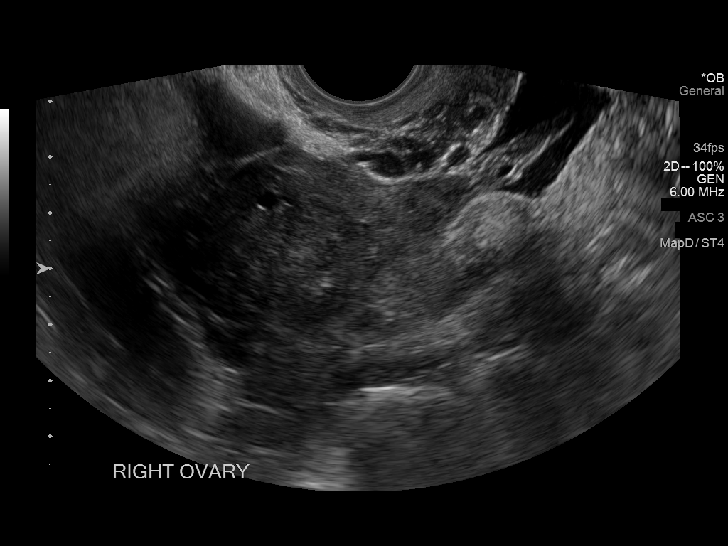
[im 72/108]
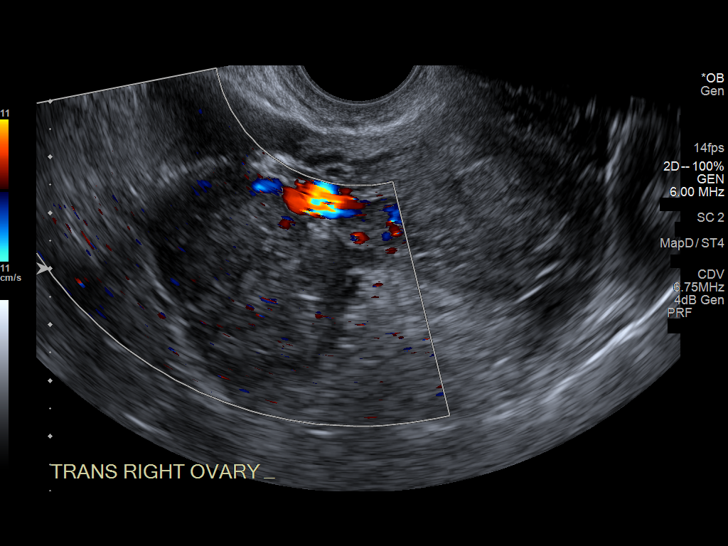
[im 80/108]
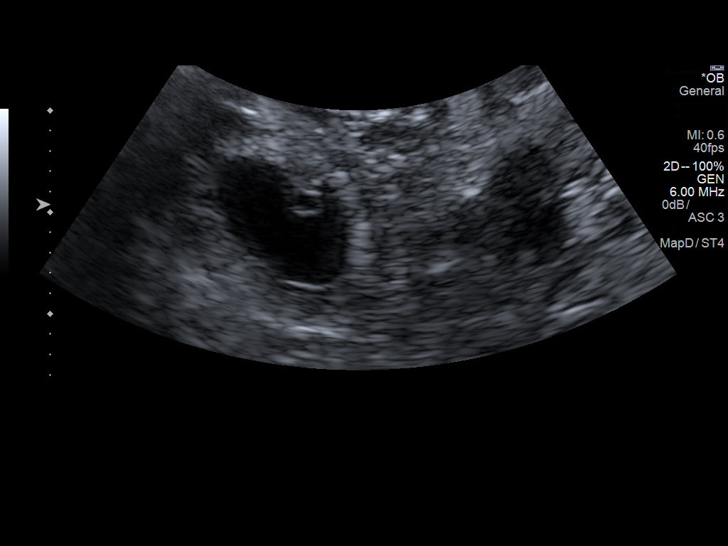
[im 88/108]
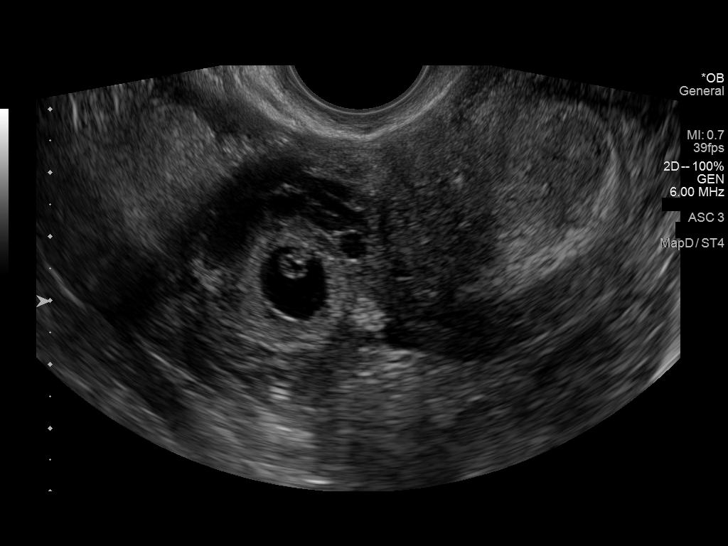
[im 96/108]
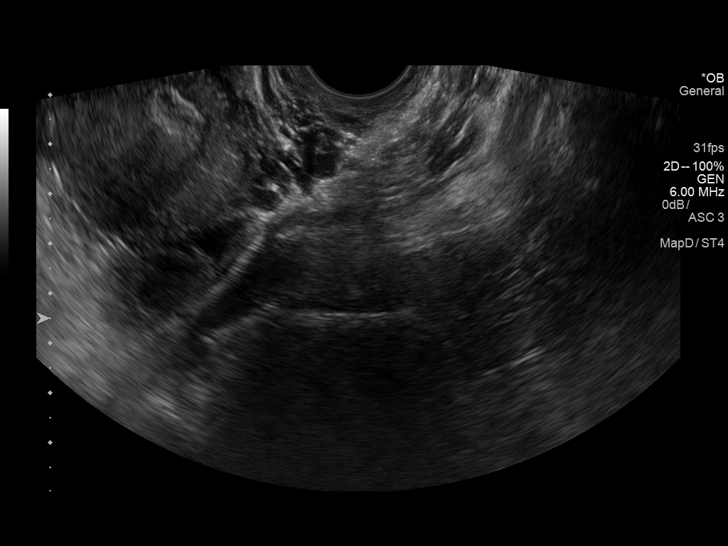
[im 104/108]
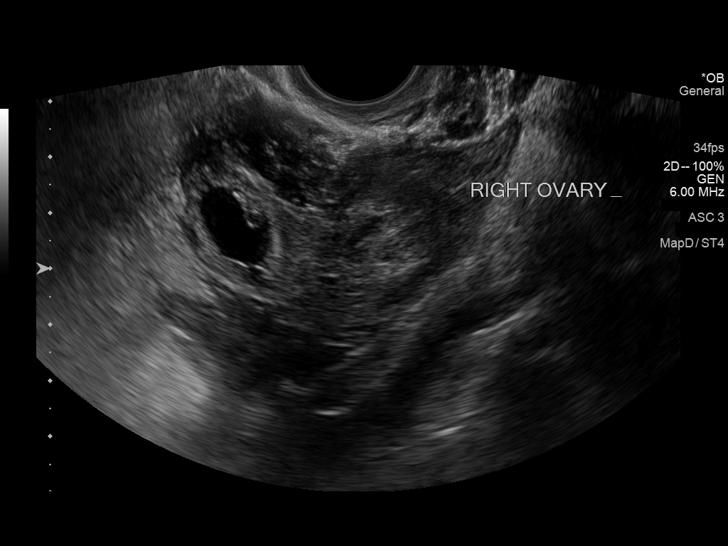

[13 of 28 positions shown; findings below may reference images not displayed]

FINDINGS: Intrauterine gestational sac: None. Extra uterine gestational sac is
identified just lateral to the right ovary.

Yolk sac:  Visualized.

Embryo:  Visualized.

Cardiac Activity: Visualized.

Heart Rate: 152 bpm

CRL:  4.7 mm   6 w   1 d                  US EDC: 11/08/2018

Subchorionic hemorrhage:  None visualized.

Maternal uterus/adnexae: Right ovary measured 5 x 2.9 x 3.4 cm. The
left ovary was not visualized. Hypoechoic fluid compatible with
blood products noted in the endometrium which measured 8.2 cm
thickness. Small to moderate volume of complex fluid in the
cul-de-sac with clot like material is also noted.
IMPRESSION: Viable 6 week 1 day ectopic pregnancy in the right adnexa adjacent
to the right ovary with hypoechoic complex fluid in the endometrial
cavity compatible with blood products. Similarly, clot like material
is seen in the free fluid within the cul-de-sac. These results were
called by telephone at the time of interpretation on 03/16/2018 at
[DATE] to Dr. ABIMELK TIGER , who verbally acknowledged these
results.

## 2020-05-21 ENCOUNTER — Emergency Department (HOSPITAL_COMMUNITY)
Admission: EM | Admit: 2020-05-21 | Discharge: 2020-05-21 | Disposition: A | Discharge status: Home / Self Care | Payer: Self-pay | Attending: Emergency Medicine | Admitting: Emergency Medicine

## 2020-05-21 ENCOUNTER — Encounter (HOSPITAL_COMMUNITY): Payer: Self-pay | Admitting: Emergency Medicine

## 2020-05-21 ENCOUNTER — Other Ambulatory Visit: Payer: Self-pay

## 2020-05-21 DIAGNOSIS — M436 Torticollis: Secondary | ICD-10-CM | POA: Insufficient documentation

## 2020-05-21 DIAGNOSIS — F17210 Nicotine dependence, cigarettes, uncomplicated: Secondary | ICD-10-CM | POA: Insufficient documentation

## 2020-05-21 DIAGNOSIS — Z79899 Other long term (current) drug therapy: Secondary | ICD-10-CM | POA: Insufficient documentation

## 2020-05-21 MED ORDER — IBUPROFEN 800 MG PO TABS: 800.0000 mg | ORAL_TABLET | Freq: Three times a day (TID) | ORAL | 0 refills | Status: AC

## 2020-05-21 MED ORDER — CYCLOBENZAPRINE HCL 10 MG PO TABS
10.0000 mg | ORAL_TABLET | Freq: Once | ORAL | Status: AC
  Administered 2020-05-21: 10 mg via ORAL
  Filled 2020-05-21: qty 1

## 2020-05-21 MED ORDER — CYCLOBENZAPRINE HCL 10 MG PO TABS
10.0000 mg | ORAL_TABLET | Freq: Two times a day (BID) | ORAL | 0 refills | Status: AC | PRN
Start: 2020-05-21 — End: ?

## 2020-05-21 MED ORDER — KETOROLAC TROMETHAMINE 30 MG/ML IJ SOLN
30.0000 mg | Freq: Once | INTRAMUSCULAR | Status: AC
  Administered 2020-05-21: 30 mg via INTRAMUSCULAR
  Filled 2020-05-21: qty 1

## 2020-05-21 NOTE — ED Triage Notes (Signed)
Patient reports right shoulder pain radiating right lateral neck this morning , pain increases with movement/changing positions , denies injury .

## 2020-05-21 NOTE — ED Notes (Signed)
Patient verbalizes understanding of discharge instructions. Opportunity for questioning and answers were provided. Armband removed by staff, pt discharged from ED.  

## 2020-05-21 NOTE — ED Provider Notes (Signed)
[facility] EMERGENCY DEPARTMENT Provider Note   CSN: [id number] Arrival date & time: 05/21/20  0455     History Chief Complaint  Patient presents with  . Shoulder Pain    Jane Finley is a 30 y.o. female.  Patient presents to the emergency department with a chief complaint of right shoulder pain.  She states that it started about 30 minutes prior to arrival when she awoke.  She denies any injuries. Denies any treatments prior to arrival. She states that it is worsened with palpation and when she turns her head.  The history is provided by the patient. No language interpreter was used.       Past Medical History:  Diagnosis Date  . Anxiety   . Bipolar 1 disorder ([facility])   . Depression     Patient Active Problem List   Diagnosis Date Noted  . Postoperative state 03/25/2018  . Ectopic pregnancy, tubal 03/17/2018  . PTSD (post-traumatic stress disorder) 11/02/2013  . Depression with suicidal ideation 10/31/2013  . Polysubstance abuse ([facility]) 10/31/2013  . Homeless 10/31/2013    Past Surgical History:  Procedure Laterality Date  . LAPAROSCOPIC UNILATERAL SALPINGECTOMY Right 03/16/2018   Procedure: LAPAROSCOPIC RIGHT SALPINGECTOMY FOR MANAGEMENT OF ECTOPIC PREGNANCY;  Surgeon: Lazaro Arms, MD;  Location: [facility];  Service: Gynecology;  Laterality: Right;  . LAPAROSCOPY N/A 03/16/2018   Procedure: LAPAROSCOPY OPERATIVE;  Surgeon: Lazaro Arms, MD;  Location: [facility];  Service: Gynecology;  Laterality: N/A;  . LAPAROSCOPY N/A 03/25/2018   Procedure: LAPAROSCOPY DIAGNOSTIC;  Surgeon: Adam Phenix, MD;  Location: [facility];  Service: Gynecology;  Laterality: N/A;  . UNILATERAL SALPINGECTOMY Left 03/25/2018   Procedure: UNILATERAL SALPINGECTOMY;  Surgeon: Adam Phenix, MD;  Location: [facility];  Service: Gynecology;  Laterality: Left;     OB History    Gravida  3   Para  1   Term  1   Preterm      AB  1   Living  1     SAB  1   TAB      Ectopic      Multiple      Live Births  1           No family history on file.  Social History   Tobacco Use  . Smoking status: Current Every Day Smoker    Packs/day: 0.25    Years: 11.00    Pack years: 2.75    Types: Cigarettes  . Smokeless tobacco: Never Used  Substance Use Topics  . Alcohol use: Yes  . Drug use: Yes    Types: Marijuana    Home Medications Prior to Admission medications   Medication Sig Start Date End Date Taking? Authorizing Provider  cetirizine (ZYRTEC ALLERGY) 10 MG tablet Take 1 tablet (10 mg total) by mouth 2 (two) times daily. Patient not taking: Reported on 09/17/2019 08/26/19   Anders Simmonds, PA-C  cetirizine (ZYRTEC) 10 MG tablet Take 1 tablet (10 mg total) by mouth 2 (two) times daily. 09/17/19   Marcelyn Bruins, MD  diphenhydrAMINE (BENADRYL) 25 MG tablet Take 25 mg by mouth every 6 (six) hours as needed for allergies.    [provider]  famotidine (PEPCID) 20 MG tablet Take 1 tablet (20 mg total) by mouth 2 (two) times daily. Patient not taking: Reported on 09/17/2019 08/26/19   Anders Simmonds, PA-C  famotidine (PEPCID) 20 MG tablet Take 1 tablet (20 mg total) by  mouth 2 (two) times daily. 09/17/19   Marcelyn Bruins, MD  fluticasone (FLONASE) 50 MCG/ACT nasal spray Place 2 sprays into both nostrils daily. 09/17/19   Marcelyn Bruins, MD  hydrocortisone 2.5 % lotion Apply topically 2 (two) times daily. Patient not taking: Reported on 08/17/2019 05/27/19   Linus Mako B, NP  Olopatadine HCl (PATADAY) 0.2 % SOLN Place 1 drop into both eyes daily. 09/17/19   Marcelyn Bruins, MD  predniSONE (DELTASONE) 10 MG tablet Take 2 tablets (20 mg total) by mouth 2 (two) times daily with a meal. Patient not taking: Reported on 09/17/2019 09/12/19   Geoffery Lyons, MD    Allergies    Patient has no known allergies.  Review of Systems   Review of Systems  All other systems reviewed and are  negative.   Physical Exam Updated Vital Signs BP (!) 128/104 (BP Location: Right Arm)   Pulse 66   Temp 98.2 F (36.8 C) (Oral)   Resp 19   Ht 5\' 2"  (1.575 m)   Wt 90 kg   LMP 05/17/2020   SpO2 99%   BMI 36.29 kg/m   Physical Exam Vitals and nursing note reviewed.  Constitutional:      General: She is not in acute distress.    Appearance: She is well-developed.  HENT:     Head: Normocephalic and atraumatic.  Eyes:     Conjunctiva/sclera: Conjunctivae normal.  Cardiovascular:     Rate and Rhythm: Normal rate and regular rhythm.     Heart sounds: No murmur.  Pulmonary:     Effort: Pulmonary effort is normal. No respiratory distress.     Breath sounds: Normal breath sounds.  Abdominal:     Palpations: Abdomen is soft.     Tenderness: There is no abdominal tenderness.  Musculoskeletal:        General: Tenderness present. Normal range of motion.     Cervical back: Neck supple.     Comments: TTP over the right upper trapezius and right cervical paraspinals No bony deformity  Skin:    General: Skin is warm and dry.     Comments: No rash on shoulder or neck  Neurological:     Mental Status: She is alert and oriented to person, place, and time.  Psychiatric:        Mood and Affect: Mood normal.        Behavior: Behavior normal.     ED Results / Procedures / Treatments   Labs (all labs ordered are listed, but only abnormal results are displayed) Labs Reviewed - No data to display  EKG None  Radiology No results found.  Procedures Procedures (including critical care time)  Medications Ordered in ED Medications  ketorolac (TORADOL) 30 MG/ML injection 30 mg (has no administration in time range)  cyclobenzaprine (FLEXERIL) tablet 10 mg (has no administration in time range)    ED Course  I have reviewed the triage vital signs and the nursing notes.  Pertinent labs & imaging results that were available during my care of the patient were reviewed by me and  considered in my medical decision making (see chart for details).    MDM Rules/Calculators/A&P                      Patient with right sided shoulder and neck pain.  Easily reproducible with palpation.  Good strength in RUE.  Symptoms seem msk.  Will treat with toradol and muscle relaxer.  Final Clinical Impression(s) / ED Diagnoses Final diagnoses:  Torticollis    Rx / DC Orders ED Discharge Orders    None       Montine Circle, PA-C 05/21/20 0551    Merryl Hacker, MD 05/21/20 [id number]

## 2020-06-22 ENCOUNTER — Other Ambulatory Visit: Payer: Self-pay

## 2020-06-22 ENCOUNTER — Ambulatory Visit (HOSPITAL_COMMUNITY)
Admission: EM | Admit: 2020-06-22 | Discharge: 2020-06-22 | Disposition: A | Discharge status: Home / Self Care | Payer: Medicaid Other | Attending: Family Medicine | Admitting: Family Medicine

## 2020-06-22 ENCOUNTER — Encounter (HOSPITAL_COMMUNITY): Payer: Self-pay

## 2020-06-22 ENCOUNTER — Emergency Department (HOSPITAL_COMMUNITY)
Admission: EM | Admit: 2020-06-22 | Discharge: 2020-06-22 | Disposition: A | Discharge status: Home / Self Care | Payer: Medicaid Other | Attending: Emergency Medicine | Admitting: Emergency Medicine

## 2020-06-22 DIAGNOSIS — R519 Headache, unspecified: Secondary | ICD-10-CM | POA: Insufficient documentation

## 2020-06-22 DIAGNOSIS — T7840XA Allergy, unspecified, initial encounter: Secondary | ICD-10-CM

## 2020-06-22 DIAGNOSIS — Z5321 Procedure and treatment not carried out due to patient leaving prior to being seen by health care provider: Secondary | ICD-10-CM | POA: Insufficient documentation

## 2020-06-22 MED ORDER — METHYLPREDNISOLONE ACETATE 80 MG/ML IJ SUSP
INTRAMUSCULAR | Status: AC
  Filled 2020-06-22: qty 1

## 2020-06-22 MED ORDER — METHYLPREDNISOLONE SODIUM SUCC 125 MG IJ SOLR: 80.0000 mg | Freq: Once | INTRAMUSCULAR | Status: DC

## 2020-06-22 MED ORDER — DIPHENHYDRAMINE HCL 25 MG PO CAPS
ORAL_CAPSULE | ORAL | Status: AC
  Filled 2020-06-22: qty 1

## 2020-06-22 MED ORDER — DIPHENHYDRAMINE HCL 25 MG PO TABS: 25.0000 mg | ORAL_TABLET | Freq: Four times a day (QID) | ORAL | 0 refills | Status: AC | PRN

## 2020-06-22 MED ORDER — PREDNISONE 10 MG (21) PO TBPK: ORAL_TABLET | ORAL | 0 refills | Status: AC

## 2020-06-22 MED ORDER — METHYLPREDNISOLONE SODIUM SUCC 125 MG IJ SOLR
125.0000 mg | Freq: Once | INTRAMUSCULAR | Status: AC
  Administered 2020-06-22: 125 mg via INTRAMUSCULAR

## 2020-06-22 MED ORDER — DIPHENHYDRAMINE HCL 25 MG PO CAPS
25.0000 mg | ORAL_CAPSULE | Freq: Once | ORAL | Status: AC
  Administered 2020-06-22: 25 mg via ORAL

## 2020-06-22 MED ORDER — METHYLPREDNISOLONE SODIUM SUCC 125 MG IJ SOLR
INTRAMUSCULAR | Status: AC
  Filled 2020-06-22: qty 2

## 2020-06-22 MED ORDER — CETIRIZINE HCL 10 MG PO TABS: 10.0000 mg | ORAL_TABLET | Freq: Two times a day (BID) | ORAL | 1 refills | Status: AC

## 2020-06-22 NOTE — Discharge Instructions (Addendum)
Steroid injection given here for allergic reaction.  You can do Benadryl every 6 hours for itching. Start the steroid pack tomorrow Zyrtec daily Follow up as needed for continued or worsening symptoms

## 2020-06-22 NOTE — ED Triage Notes (Signed)
Pt reports that she woke up this morning and has a hematoma to her forehead and does not know how it got there, denies drug or alcohol use.

## 2020-06-22 NOTE — ED Notes (Signed)
Pt stated "There is too many people here, I'm going to go to a different hospital."

## 2020-06-22 NOTE — ED Provider Notes (Signed)
[facility]    CSN: [id number] Arrival date & time: 06/22/20  1156      History   Chief Complaint Chief Complaint  Patient presents with  . Rash  . knot on head    HPI Jane Finley is a 30 y.o. female.   Patient is a 30 year old female past medical history of anxiety, bipolar, depression.  She presents today with generalized hives.  This started this morning upon awakening.  History of urticaria and allergic reactions in the past.  No oral swelling, trouble swallowing or breathing.  Does have generalized swelling to forehead and scalp area.  Has not take anything for her symptoms.  Denies any fever, joint pain. Denies any recent changes in lotions, detergents, foods or other possible irritants. No recent travel. Nobody else at home has the rash. Patient has been outside but denies any contact with plants or insects. No new foods or medications.   ROS per HPI      Past Medical History:  Diagnosis Date  . Anxiety   . Bipolar 1 disorder ([facility])   . Depression     Patient Active Problem List   Diagnosis Date Noted  . Postoperative state 03/25/2018  . Ectopic pregnancy, tubal 03/17/2018  . PTSD (post-traumatic stress disorder) 11/02/2013  . Depression with suicidal ideation 10/31/2013  . Polysubstance abuse ([facility]) 10/31/2013  . Homeless 10/31/2013    Past Surgical History:  Procedure Laterality Date  . LAPAROSCOPIC UNILATERAL SALPINGECTOMY Right 03/16/2018   Procedure: LAPAROSCOPIC RIGHT SALPINGECTOMY FOR MANAGEMENT OF ECTOPIC PREGNANCY;  Surgeon: Lazaro Arms, MD;  Location: [facility];  Service: Gynecology;  Laterality: Right;  . LAPAROSCOPY N/A 03/16/2018   Procedure: LAPAROSCOPY OPERATIVE;  Surgeon: Lazaro Arms, MD;  Location: [facility];  Service: Gynecology;  Laterality: N/A;  . LAPAROSCOPY N/A 03/25/2018   Procedure: LAPAROSCOPY DIAGNOSTIC;  Surgeon: Adam Phenix, MD;  Location: [facility];  Service: Gynecology;  Laterality: N/A;  . UNILATERAL SALPINGECTOMY  Left 03/25/2018   Procedure: UNILATERAL SALPINGECTOMY;  Surgeon: Adam Phenix, MD;  Location: [facility];  Service: Gynecology;  Laterality: Left;    OB History    Gravida  3   Para  1   Term  1   Preterm      AB  1   Living  1     SAB  1   TAB      Ectopic      Multiple      Live Births  1            Home Medications    Prior to Admission medications   Medication Sig Start Date End Date Taking? Authorizing Provider  cetirizine (ZYRTEC ALLERGY) 10 MG tablet Take 1 tablet (10 mg total) by mouth 2 (two) times daily. 06/22/20   Dahlia Byes A, NP  cyclobenzaprine (FLEXERIL) 10 MG tablet Take 1 tablet (10 mg total) by mouth 2 (two) times daily as needed for muscle spasms. 05/21/20   Roxy Horseman, PA-C  diphenhydrAMINE (BENADRYL) 25 MG tablet Take 1 tablet (25 mg total) by mouth every 6 (six) hours as needed. 06/22/20   Cobey Raineri, Gloris Manchester A, NP  fluticasone (FLONASE) 50 MCG/ACT nasal spray Place 2 sprays into both nostrils daily. 09/17/19   Marcelyn Bruins, MD  hydrocortisone 2.5 % lotion Apply topically 2 (two) times daily. Patient not taking: Reported on 08/17/2019 05/27/19   Linus Mako B, NP  ibuprofen (ADVIL) 800 MG tablet Take 1 tablet (800 mg total) by  mouth 3 (three) times daily. 05/21/20   Roxy Horseman, PA-C  Olopatadine HCl (PATADAY) 0.2 % SOLN Place 1 drop into both eyes daily. 09/17/19   Marcelyn Bruins, MD  predniSONE (STERAPRED UNI-PAK 21 TAB) 10 MG (21) TBPK tablet 6 tabs for 1 day, then 5 tabs for 1 das, then 4 tabs for 1 day, then 3 tabs for 1 day, 2 tabs for 1 day, then 1 tab for 1 day 06/22/20   Dahlia Byes A, NP  famotidine (PEPCID) 20 MG tablet Take 1 tablet (20 mg total) by mouth 2 (two) times daily. 09/17/19 06/22/20  Marcelyn Bruins, MD    Family History No family history on file.  Social History Social History   Tobacco Use  . Smoking status: Current Every Day Smoker    Packs/day: 0.50    Years: 11.00    Pack years:  5.50    Types: Cigarettes  . Smokeless tobacco: Never Used  Vaping Use  . Vaping Use: Never used  Substance Use Topics  . Alcohol use: Yes  . Drug use: Yes    Types: Marijuana     Allergies   Patient has no known allergies.   Review of Systems Review of Systems   Physical Exam Triage Vital Signs ED Triage Vitals  Enc Vitals Group     BP 06/22/20 1300 120/83     Pulse Rate 06/22/20 1300 81     Resp 06/22/20 1300 14     Temp 06/22/20 1300 98.4 F (36.9 C)     Temp src --      SpO2 06/22/20 1300 99 %     Weight --      Height --      Head Circumference --      Peak Flow --      Pain Score 06/22/20 1259 8     Pain Loc --      Pain Edu? --      Excl. in GC? --    No data found.  Updated Vital Signs BP 120/83   Pulse 81   Temp 98.4 F (36.9 C)   Resp 14   LMP 06/15/2020 (Within Days)   SpO2 99%   Visual Acuity Right Eye Distance:   Left Eye Distance:   Bilateral Distance:    Right Eye Near:   Left Eye Near:    Bilateral Near:     Physical Exam Vitals and nursing note reviewed.  Constitutional:      General: She is not in acute distress.    Appearance: Normal appearance. She is not ill-appearing, toxic-appearing or diaphoretic.  HENT:     Head: Normocephalic.     Comments: Swelling to forehead extending into scalp area No bruising    Nose: Nose normal.  Eyes:     Conjunctiva/sclera: Conjunctivae normal.  Pulmonary:     Effort: Pulmonary effort is normal.  Musculoskeletal:        General: Normal range of motion.     Cervical back: Normal range of motion.  Skin:    General: Skin is warm and dry.     Findings: Rash present.     Comments: Generalized urticaria located to buttocks area and extremities  Neurological:     Mental Status: She is alert.  Psychiatric:        Mood and Affect: Mood normal.      UC Treatments / Results  Labs (all labs ordered are listed, but only abnormal results are displayed) Labs  Reviewed - No data to  display  EKG   Radiology No results found.  Procedures Procedures (including critical care time)  Medications Ordered in UC Medications  diphenhydrAMINE (BENADRYL) capsule 25 mg (25 mg Oral Given 06/22/20 1354)  methylPREDNISolone sodium succinate (SOLU-MEDROL) 125 mg/2 mL injection 125 mg (125 mg Intramuscular Given 06/22/20 1358)    Initial Impression / Assessment and Plan / UC Course  I have reviewed the triage vital signs and the nursing notes.  Pertinent labs & imaging results that were available during my care of the patient were reviewed by me and considered in my medical decision making (see chart for details).     Allergic reaction Treating with steroid injection here in clinic.   Prednisone taper to start tomorrow.  Zyrtec daily and Benadryl as needed Follow up as needed for continued or worsening symptoms  Final Clinical Impressions(s) / UC Diagnoses   Final diagnoses:  Allergic reaction, initial encounter     Discharge Instructions     Steroid injection given here for allergic reaction.  You can do Benadryl every 6 hours for itching. Start the steroid pack tomorrow Zyrtec daily Follow up as needed for continued or worsening symptoms     ED Prescriptions    Medication Sig Dispense Auth. Provider   cetirizine (ZYRTEC ALLERGY) 10 MG tablet Take 1 tablet (10 mg total) by mouth 2 (two) times daily. 30 tablet Halil Rentz A, NP   predniSONE (STERAPRED UNI-PAK 21 TAB) 10 MG (21) TBPK tablet 6 tabs for 1 day, then 5 tabs for 1 das, then 4 tabs for 1 day, then 3 tabs for 1 day, 2 tabs for 1 day, then 1 tab for 1 day 21 tablet Vrishank Moster A, NP   diphenhydrAMINE (BENADRYL) 25 MG tablet Take 1 tablet (25 mg total) by mouth every 6 (six) hours as needed. 30 tablet Loura Halt A, NP     PDMP not reviewed this encounter.   Orvan July, NP 06/22/20 1426

## 2020-06-22 NOTE — ED Triage Notes (Signed)
Patient woke up this morning with a "knot on forehead." also reports hives.

## 2023-03-01 DIAGNOSIS — Z419 Encounter for procedure for purposes other than remedying health state, unspecified: Secondary | ICD-10-CM | POA: Diagnosis not present

## 2023-04-01 DIAGNOSIS — Z419 Encounter for procedure for purposes other than remedying health state, unspecified: Secondary | ICD-10-CM | POA: Diagnosis not present

## 2023-05-01 DIAGNOSIS — Z419 Encounter for procedure for purposes other than remedying health state, unspecified: Secondary | ICD-10-CM | POA: Diagnosis not present

## 2023-06-01 DIAGNOSIS — Z419 Encounter for procedure for purposes other than remedying health state, unspecified: Secondary | ICD-10-CM | POA: Diagnosis not present

## 2023-07-01 DIAGNOSIS — Z419 Encounter for procedure for purposes other than remedying health state, unspecified: Secondary | ICD-10-CM | POA: Diagnosis not present

## 2023-08-01 DIAGNOSIS — Z419 Encounter for procedure for purposes other than remedying health state, unspecified: Secondary | ICD-10-CM | POA: Diagnosis not present

## 2023-09-01 DIAGNOSIS — Z419 Encounter for procedure for purposes other than remedying health state, unspecified: Secondary | ICD-10-CM | POA: Diagnosis not present

## 2023-10-01 DIAGNOSIS — Z419 Encounter for procedure for purposes other than remedying health state, unspecified: Secondary | ICD-10-CM | POA: Diagnosis not present

## 2023-11-01 DIAGNOSIS — Z419 Encounter for procedure for purposes other than remedying health state, unspecified: Secondary | ICD-10-CM | POA: Diagnosis not present

## 2023-12-01 DIAGNOSIS — Z419 Encounter for procedure for purposes other than remedying health state, unspecified: Secondary | ICD-10-CM | POA: Diagnosis not present
